# Patient Record
Sex: Female | Born: 1975
Health system: Southern US, Community
[De-identification: ages and names within clinical notes are randomized; demographics above are authoritative.]

## PROBLEM LIST (undated history)

## (undated) DIAGNOSIS — N12 Tubulo-interstitial nephritis, not specified as acute or chronic: Secondary | ICD-10-CM

## (undated) DIAGNOSIS — K219 Gastro-esophageal reflux disease without esophagitis: Secondary | ICD-10-CM

## (undated) DIAGNOSIS — Z6741 Type O blood, Rh negative: Secondary | ICD-10-CM

## (undated) HISTORY — DX: Tubulo-interstitial nephritis, not specified as acute or chronic: N12

## (undated) HISTORY — DX: Gastro-esophageal reflux disease without esophagitis: K21.9

## (undated) HISTORY — DX: Type O blood, Rh negative: Z67.41

---

## 2000-08-08 DIAGNOSIS — N12 Tubulo-interstitial nephritis, not specified as acute or chronic: Secondary | ICD-10-CM

## 2000-08-08 HISTORY — DX: Tubulo-interstitial nephritis, not specified as acute or chronic: N12

## 2006-07-24 ENCOUNTER — Ambulatory Visit: Payer: Self-pay | Admitting: Gynecology

## 2006-07-24 ENCOUNTER — Encounter (INDEPENDENT_AMBULATORY_CARE_PROVIDER_SITE_OTHER): Payer: Self-pay | Admitting: Gynecology

## 2007-07-19 ENCOUNTER — Encounter (INDEPENDENT_AMBULATORY_CARE_PROVIDER_SITE_OTHER): Payer: Self-pay | Admitting: Gynecology

## 2007-07-19 ENCOUNTER — Ambulatory Visit: Payer: Self-pay | Admitting: Gynecology

## 2009-05-12 ENCOUNTER — Encounter: Payer: Self-pay | Admitting: Obstetrics & Gynecology

## 2009-05-12 ENCOUNTER — Ambulatory Visit: Payer: Self-pay | Admitting: Obstetrics & Gynecology

## 2009-08-08 HISTORY — PX: AUGMENTATION MAMMAPLASTY: SUR837

## 2009-08-08 HISTORY — PX: BREAST ENHANCEMENT SURGERY: SHX7

## 2010-07-06 ENCOUNTER — Ambulatory Visit: Payer: Self-pay | Admitting: Obstetrics & Gynecology

## 2010-12-21 NOTE — Assessment & Plan Note (Signed)
Holly Powell                 ACCOUNT NO.:  [id number]   MEDICAL RECORD NO.:  [id number]          PATIENT TYPE:  POB   LOCATION:  [facility] at [facility]         FACILITY:  [facility]   PHYSICIAN:  Jaynie Collins, MD     DATE OF BIRTH:  January 28, 1976   DATE OF SERVICE:  07/06/2010                                  CLINIC NOTE   REASON FOR VISIT:  Annual examination.   Holly Powell is a 35-year gravida 4, para 4 with last menstrual period of  June 18, 2010, who is here for annual examination.  The patient does  report having breakthrough bleeding about 3-4 episodes over the last  year and feels this is related to her Aldean Ast which is a 20 mcg estradiol  pill.  She has it around the same time every cycle.  She denies any  abdominal pain or any other gynecologic symptoms.  The patient underwent  a divorce since the last time we saw her and does report she is in a  sexual monogamous relationship with her boyfriend for the past year.  She denies any other gynecologic concerns.   PAST OB/GYN HISTORY:  Four vaginal deliveries.  The patient has normal  menstrual cycles that are regular but periodically has breakthrough  bleeding on the Lutera.  She does not have any history of cervical  dysplasia or sexually transmitted infections.  Her last Pap smear was in  October 2010 which was normal.   PAST MEDICAL HISTORY:  None.   PAST SURGICAL HISTORY:  The patient had bilateral saline breast implants  in August of this year.   MEDICATIONS:  Lutera.   ALLERGIES:  PENICILLIN which causes a rash.   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  She  lives with her 4 children.  She denies smoking, drinking, or using  illicit drugs.  She denies any past or current history of sexual or  physical abuse.   FAMILY HISTORY:  The patient's mother has diabetes, pancreatitis, and  high blood pressure.  There is no other  family history of any condition  and she denies any family history of any gynecologic, colon, or breast  cancers.   REVIEW OF SYSTEMS:  Comprehensive review of systems was reviewed with  the patient, was entirely negative.   PHYSICAL EXAMINATION:  VITAL SIGNS:  Blood pressure is 115/74, pulse 82,  weight 147 pounds, height 5 feet and 3-1/2 inches.  GENERAL:  No apparent distress.  HEENT:  Normocephalic, atraumatic.  NECK:  Supple.  No masses.  Normal thyroid.  LUNGS:  Clear to auscultation bilaterally.  HEART:  Regular rate and rhythm.  ABDOMEN:  Soft, nontender, nondistended.  BREASTS:  Symmetric in size, soft.  No tenderness, abnormal masses, skin  changes, lymphadenopathy, or nipple drainage noted.  EXTREMITIES:  No cyanosis, clubbing, or edema.  Nontender.  PELVIC:  Normal external female genitalia.  Pink, well-rugated vagina.  Small mobile uterus.  Normal adnexa.  Normal cervix.  Pap smear was  obtained.   ASSESSMENT AND PLAN:  The patient is a 35 year old gravida 4, para 4 who  is here for annual examination.  She did have a normal breast  examination and Pap smear was obtained.  We will follow up on the  results.  The patient did want a refill for her birth control method but  wanted to try another medication given her episodes of breakthrough  bleeding.  She was given samples of Femcon Fe and given that this does  have more estradiol and also a prescription for this.  Due to cough, she  was also given an alternate prescription for Ortho-Cyclen and told that  she can use either one and see whichever one is cheaper for her and see  if that also helps with her breakthrough bleeding.  If this does  continue to occur even with the higher estradiol pill, we will consider  an ultrasound to rule out a structural anomaly versus other evaluation  which may include laboratory evaluation or an endometrial biopsy.  The  patient was counseled regarding other modalities of birth control   including the NuvaRing and intrauterine device Implanon but she does not  want to try this and also does not want permanent sterilization.  The  patient was told to call or come back in for any further gynecologic  concerns.           ______________________________  Jaynie Collins, MD     UA/MEDQ  D:  07/06/2010  T:  07/06/2010  Job:  [id number]

## 2010-12-21 NOTE — Assessment & Plan Note (Signed)
Holly Powell, Holly Powell                 ACCOUNT NO.:  [id number]   MEDICAL RECORD NO.:  [id number]          PATIENT TYPE:  POB   LOCATION:  [facility] at [facility]         FACILITY:  [facility]   PHYSICIAN:  Jaynie Collins, MD     DATE OF BIRTH:  [birth year]   DATE OF SERVICE:  05/12/2009                                  CLINIC NOTE   The patient is a 35 year old, gravida 4, para 4, who is here for her  annual examination.  The patient was last seen in December 2008.  She  reports that she is on [place] birth control which she has gone off and  on because at some point, she thought she was going to try to get  pregnant and she also has alternating periods of taking it continuously  and also taking it with having the placebo week and in the last 6  months, she has realized that even though she is not taking it  continuously her menstrual periods come every 6 weeks.  She is now  starting to take them continuously and says that this is going much  better and she wants a refill for the Lutera to be able to take it  continuously.  She wants a refill to be faxed to her [employer].  Apart from her concerns about her periods, she has no other concerns.  She has no intermenstrual bleeding.  No abnormal vaginal discharge.  No  problems with intercourse or any other gynecologic symptoms.   The patient is concerned about the number of people that she notes that  her young getting breast cancer.  She is wondering if there was any way  she can get an mammogram scheduled.  She is also looking for more  information about the new hysteroscopic permanent sterilization using  silicone implants and she was told that as more information becomes  available this will may be made available to her.   PAST OBSTETRICAL/GYNECOLOGICAL HISTORY:  The patient has had 4 vaginal  deliveries.  Her menstrual history is as above.  She is satisfied with  the Lutera 28.  She is in a monogamous relationship and is married.  Her  last  Pap smear was in December 2008 which was normal.  She does not have  any history of abnormal Pap smears or sexually transmitted infections.   PAST MEDICAL HISTORY:  None.   PAST SURGICAL HISTORY:  None.   MEDICATIONS:  Lutera 28.   ALLERGIES:  PENICILLIN which causes a rash.   SOCIAL HISTORY:  The patient works as an English as a second language teacher at  [employer], who they specialized in Cytogeneticist to  schools.  She lives with her husband and 4 kids.  She denies smoking,  drinking, or taking illicit drugs.  She denies any past or current  history of sexual or physical abuse.   FAMILY HISTORY:  The patient's mother has diabetes and pancreatitis and  also high blood pressure.  There is no other family history of any  condition and she denies any family history of any cancers including  gynecologic, colon, or breast.   REVIEW OF SYSTEMS:  Comprehensive review of systems was  reviewed with  the patient that was entirely negative.  The patient does say that she  exercises 5 times a week and she does not take multivitamins, this was  strongly encouraged.   PHYSICAL EXAMINATION:  VITAL SIGNS:  Blood pressure 120/80, pulse 71,  weight 139 pounds, height 5-3-1/2 inches.  GENERAL:  No apparent distress.  HEENT:  Normocephalic, atraumatic.  NECK:  Supple.  No masses.  Normal thyroid.  LUNGS:  Clear to auscultation bilaterally.  HEART:  Regular rate and rhythm.  BREASTS:  Symmetric in size, soft, no tenderness, abnormal masses, skin  changes, or lymphadenopathy, or nipple drainage noted.  ABDOMEN:  Soft,  nontender, nondistended.  EXTREMITIES:  No cyanosis, clubbing, or edema.  Nontender.  PELVIC:  Normal external female genitalia.  Pink and well rugated  vagina.  Small mobile anteverted uterus.  Normal adnexa on  bimanual exam.  SPECULUM:  The patient had a pink, well rugated vagina, normal  discharge.  No lesions.  Multiparous cervix noted.  Pap smear sample  obtained.  No  tenderness on examination.  Rectum within normal limits on  external evaluation.   ASSESSMENT AND PLAN:  The patient is a 35-year gravida 4, para 4, who is  here today for her annual examination.  The patient does want to take  her birth controls continuously and a prescription was made out to be  faxed over to [facility] for her to be able to do this.  She did have a  negative urine pregnancy test today.  The patient is interested in  hysteroscopic permanent sterilization and will think about it some more  before making a final decision.  She was told to call us if she decides  to proceed with this patient.  The patient has no other concerns.  She  was told to come back to the clinic for any further gynecologic  problems.  The patient did decline getting a sexually transmitted  infection screen today, but does want to check her lipid panel,  comprehensive metabolic panel, CBC, and a TSH.  She will return when she  is fasting to have these labs checked, and we will follow up the  results.           ______________________________  Jaynie Collins, MD     UA/MEDQ  D:  05/12/2009  T:  05/13/2009  Job:  [id number]

## 2012-03-16 ENCOUNTER — Other Ambulatory Visit (INDEPENDENT_AMBULATORY_CARE_PROVIDER_SITE_OTHER): Payer: 59 | Admitting: Gynecology

## 2012-03-16 DIAGNOSIS — N912 Amenorrhea, unspecified: Secondary | ICD-10-CM

## 2012-03-16 NOTE — Progress Notes (Signed)
Patient was seen today for a positive pregnacy test. Patient will be making a follow visit for new ob.

## 2012-03-28 ENCOUNTER — Other Ambulatory Visit (INDEPENDENT_AMBULATORY_CARE_PROVIDER_SITE_OTHER): Payer: 59 | Admitting: *Deleted

## 2012-03-28 DIAGNOSIS — Z3480 Encounter for supervision of other normal pregnancy, unspecified trimester: Secondary | ICD-10-CM

## 2012-03-28 NOTE — Progress Notes (Signed)
hcg quant/tn

## 2012-03-29 LAB — HCG, QUANTITATIVE, PREGNANCY: hCG, Beta Chain, Quant, S: 139708.5 m[IU]/mL

## 2012-04-04 ENCOUNTER — Ambulatory Visit (INDEPENDENT_AMBULATORY_CARE_PROVIDER_SITE_OTHER): Payer: 59 | Admitting: Obstetrics & Gynecology

## 2012-04-04 ENCOUNTER — Encounter: Payer: Self-pay | Admitting: Obstetrics & Gynecology

## 2012-04-04 VITALS — BP 111/68 | Ht 63.5 in | Wt 151.0 lb

## 2012-04-04 DIAGNOSIS — Z3490 Encounter for supervision of normal pregnancy, unspecified, unspecified trimester: Secondary | ICD-10-CM | POA: Insufficient documentation

## 2012-04-04 DIAGNOSIS — K219 Gastro-esophageal reflux disease without esophagitis: Secondary | ICD-10-CM

## 2012-04-04 DIAGNOSIS — Z124 Encounter for screening for malignant neoplasm of cervix: Secondary | ICD-10-CM

## 2012-04-04 DIAGNOSIS — O09529 Supervision of elderly multigravida, unspecified trimester: Secondary | ICD-10-CM

## 2012-04-04 DIAGNOSIS — Z3480 Encounter for supervision of other normal pregnancy, unspecified trimester: Secondary | ICD-10-CM

## 2012-04-04 MED ORDER — PANTOPRAZOLE SODIUM 40 MG PO TBEC: 40.0000 mg | DELAYED_RELEASE_TABLET | Freq: Every day | ORAL | Status: DC

## 2012-04-04 NOTE — Progress Notes (Signed)
   Subjective:    Holly Powell is a [id number] [redacted]w[redacted]d being seen today for her first obstetrical visit.  Her obstetrical history is significant for advanced maternal age. Patient does intend to breast feed. Pregnancy history fully reviewed.  Patient reports heartburn, nausea, no bleeding and no dysuria.  Filed Vitals:   04/04/12 1118 04/04/12 1120  BP: 111/68   Height:  5' 3.5" (1.613 m)  Weight: 151 lb (68.493 kg)     HISTORY: OB History    Grav Para Term Preterm Abortions TAB SAB Ect Mult Living   5 4 4       4      # Outc Date GA Lbr Len/2nd Wgt Sex Del Anes PTL Lv   1 TRM 1994    M SVD EPI  Yes   2 TRM 41    M SVD EPI  Yes   3 TRM 2002    M SVD EPI  Yes   4 TRM 2005    M  None  Yes   Comments: Patient delivered in 3 hours, she barely made it to the hospital.   5 CUR              Past Medical History  Diagnosis Date  . Pyelonephritis 2002    history of in2002 pregnancy   Past Surgical History  Procedure Date  . Breast enhancement surgery 2011   Family History  Problem Relation Age of Onset  . Diabetes Mother   . Hypertension Mother   . Pancreatitis Mother      Exam    Uterus:   8 weeks  Pelvic Exam:    Perineum: No Hemorrhoids   Vulva: normal   Vagina:  normal mucosa, normal discharge   pH:    Cervix: no lesions   Adnexa: normal adnexa   Bony Pelvis: average  System: Breast:  normal appearance, no masses or tenderness, bilateral implants   Skin: normal coloration and turgor, no rashes    Neurologic: oriented, normal, normal mood   Extremities: normal strength, tone, and muscle mass   HEENT oropharynx clear, no lesions, neck supple with midline trachea and thyroid without masses   Mouth/Teeth mucous membranes moist, pharynx normal without lesions and dental hygiene good   Neck supple and no masses   Cardiovascular: regular rate and rhythm   Respiratory:  appears well, vitals normal, no respiratory distress, acyanotic, normal RR, neck free of mass or  lymphadenopathy, chest clear, no wheezing, crepitations, rhonchi, normal symmetric air entry   Abdomen: soft, non-tender; bowel sounds normal; no masses,  no organomegaly   Urinary: urethral meatus normal      Assessment:    Pregnancy: [id number] Patient Active Problem List  Diagnosis  . Supervision of normal pregnancy  . Maternal age 38+, multigravida     Nausea and heartburn  Plan:     Initial labs drawn. Prenatal vitamins. Problem list reviewed and updated. Genetic Screening discussed First Screen, Quad Screen and Amniocentesis: undecided.  Ultrasound discussed; fetal survey: at 18-19 weeks.  Follow up in 4 weeks. 50% of 30 min visit spent on counseling and coordination of care.  RTC 4 weeks Protonix for GERD sx   Yusif Gnau 04/04/2012

## 2012-04-04 NOTE — Progress Notes (Signed)
New OB visit.  Needs ob panel drawn.  Last pap done at least two years ago.

## 2012-04-04 NOTE — Patient Instructions (Signed)
AFP Maternal This is a routine screen (tests) used to check for fetal abnormalities such as Down syndrome and neural tube defects. Down Syndrome is a chromosomal abnormality, sometimes called Trisomy 21. Neural tube defects are serious birth defects. The brain, spinal cord, or their coverings do not develop completely. Women should be tested in the 15th to 20th week of pregnancy. The msAFP screen involves three or four tests that measure substances found in the blood that make the testing better. During development, AFP levels in fetal blood and amniotic fluid rise until about 12 weeks. The levels then gradually fall until birth. AFP is a protein produce by fetal tissue. AFP crosses the placenta and appears in the maternal blood. A baby with an open neural tube defect has an opening in its spine, head, or abdominal wall that allows higher-than-usual amounts of AFP to pass into the mother's blood. If a screen is positive, more tests are needed to make a diagnosis. These include ultrasound and perhaps amniocentesis (checking the fluid that surrounds the baby). These tests are used to help women and their caregivers make decisions about the management of their pregnancies. In pregnancies where the fetus is carrying the chromosomal defect that results in Down syndrome, the levels of AFP and unconjugated estriol tend to be low and hCG and inhibin A levels high.  PREPARATION FOR TEST Blood is drawn from a vein in your arm usually between the 15th and 20th weeks of pregnancy. Four different tests on your blood are done. These are AFP, hCG, unconjugated estriol, and inhibin A. The combination of tests produces a more accurate result. NORMAL FINDINGS   Adult: less than 40ng/mL or less than 40 mg/L (SI units)   Child younger than1 year: less than 30 ng/mL  Ranges are stratified by weeks of gestation and vary among laboratories. Ranges for normal findings may vary among different laboratories and hospitals. You  should always check with your doctor after having lab work or other tests done to discuss the meaning of your test results and whether your values are considered within normal limits. MEANING OF TEST  These are screening tests. Not all fetal abnormalities will give positive test results. Of all women who have positive AFP screening results, only a very small number of them have babies who actually have a neural tube defect or chromosomal abnormality. Your caregiver will go over the test results with you and discuss the importance and meaning of your results, as well as treatment options and the need for additional tests if necessary. OBTAINING THE TEST RESULTS It is your responsibility to obtain your test results. Ask the lab or department performing the test when and how you will get your results. Document Released: 08/16/2004 Document Revised: 07/14/2011 Document Reviewed: 06/28/2008 [employer] Patient Information 2012 [employer]. 

## 2012-04-05 LAB — OBSTETRIC PANEL
Antibody Screen: NEGATIVE
Basophils Relative: 0 % (ref 0–1)
Eosinophils Absolute: 0.1 10*3/uL (ref 0.0–0.7)
Eosinophils Relative: 1 % (ref 0–5)
MCH: 29.7 pg (ref 26.0–34.0)
MCHC: 33.6 g/dL (ref 30.0–36.0)
MCV: 88.3 fL (ref 78.0–100.0)
Monocytes Absolute: 0.5 10*3/uL (ref 0.1–1.0)
Monocytes Relative: 6 % (ref 3–12)
RBC: 3.6 MIL/uL — ABNORMAL LOW (ref 3.87–5.11)
Rh Type: NEGATIVE
WBC: 8.5 10*3/uL (ref 4.0–10.5)

## 2012-04-06 LAB — CYSTIC FIBROSIS DIAGNOSTIC STUDY: Date of Birth: [birth year]

## 2012-05-04 ENCOUNTER — Other Ambulatory Visit: Payer: Self-pay | Admitting: Obstetrics & Gynecology

## 2012-05-04 ENCOUNTER — Ambulatory Visit (INDEPENDENT_AMBULATORY_CARE_PROVIDER_SITE_OTHER): Payer: 59 | Admitting: Obstetrics & Gynecology

## 2012-05-04 VITALS — BP 109/69 | Wt 150.0 lb

## 2012-05-04 DIAGNOSIS — Z3490 Encounter for supervision of normal pregnancy, unspecified, unspecified trimester: Secondary | ICD-10-CM

## 2012-05-04 DIAGNOSIS — Z3682 Encounter for antenatal screening for nuchal translucency: Secondary | ICD-10-CM

## 2012-05-04 DIAGNOSIS — Z3480 Encounter for supervision of other normal pregnancy, unspecified trimester: Secondary | ICD-10-CM

## 2012-05-04 DIAGNOSIS — O09529 Supervision of elderly multigravida, unspecified trimester: Secondary | ICD-10-CM

## 2012-05-04 NOTE — Progress Notes (Signed)
First trimester screen/Harmony test ordered via MFM. No complaints. Obstetric precautions reviewed.

## 2012-05-04 NOTE — Progress Notes (Signed)
No other complaints or concerns.  Obstetric precautions reviewed.

## 2012-05-04 NOTE — Patient Instructions (Addendum)
Pregnancy - Second Trimester The second trimester of pregnancy (3 to 6 months) is a period of rapid growth for you and your baby. At the end of the sixth month, your baby is about 9 inches long and weighs 1 1/2 pounds. You will begin to feel the baby move between 18 and 20 weeks of the pregnancy. This is called quickening. Weight gain is faster. A clear fluid (colostrum) may leak out of your breasts. You may feel small contractions of the womb (uterus). This is known as false labor or Braxton-Hicks contractions. This is like a practice for labor when the baby is ready to be born. Usually, the problems with morning sickness have usually passed by the end of your first trimester. Some women develop small dark blotches (called cholasma, mask of pregnancy) on their face that usually goes away after the baby is born. Exposure to the sun makes the blotches worse. Acne may also develop in some pregnant women and pregnant women who have acne, may find that it goes away. PRENATAL EXAMS  Blood work may continue to be done during prenatal exams. These tests are done to check on your health and the probable health of your baby. Blood work is used to follow your blood levels (hemoglobin). Anemia (low hemoglobin) is common during pregnancy. Iron and vitamins are given to help prevent this. You will also be checked for diabetes between 24 and 28 weeks of the pregnancy. Some of the previous blood tests may be repeated.   The size of the uterus is measured during each visit. This is to make sure that the baby is continuing to grow properly according to the dates of the pregnancy.   Your blood pressure is checked every prenatal visit. This is to make sure you are not getting toxemia.   Your urine is checked to make sure you do not have an infection, diabetes or protein in the urine.   Your weight is checked often to make sure gains are happening at the suggested rate. This is to ensure that both you and your baby are  growing normally.   Sometimes, an ultrasound is performed to confirm the proper growth and development of the baby. This is a test which bounces harmless sound waves off the baby so your caregiver can more accurately determine due dates.  Sometimes, a specialized test is done on the amniotic fluid surrounding the baby. This test is called an amniocentesis. The amniotic fluid is obtained by sticking a needle into the belly (abdomen). This is done to check the chromosomes in instances where there is a concern about possible genetic problems with the baby. It is also sometimes done near the end of pregnancy if an early delivery is required. In this case, it is done to help make sure the baby's lungs are mature enough for the baby to live outside of the womb. CHANGES OCCURING IN THE SECOND TRIMESTER OF PREGNANCY Your body goes through many changes during pregnancy. They vary from person to person. Talk to your caregiver about changes you notice that you are concerned about.  During the second trimester, you will likely have an increase in your appetite. It is normal to have cravings for certain foods. This varies from person to person and pregnancy to pregnancy.   Your lower abdomen will begin to bulge.   You may have to urinate more often because the uterus and baby are pressing on your bladder. It is also common to get more bladder infections during pregnancy (  pain with urination). You can help this by drinking lots of fluids and emptying your bladder before and after intercourse.   You may begin to get stretch marks on your hips, abdomen, and breasts. These are normal changes in the body during pregnancy. There are no exercises or medications to take that prevent this change.   You may begin to develop swollen and bulging veins (varicose veins) in your legs. Wearing support hose, elevating your feet for 15 minutes, 3 to 4 times a day and limiting salt in your diet helps lessen the problem.    Heartburn may develop as the uterus grows and pushes up against the stomach. Antacids recommended by your caregiver helps with this problem. Also, eating smaller meals 4 to 5 times a day helps.   Constipation can be treated with a stool softener or adding bulk to your diet. Drinking lots of fluids, vegetables, fruits, and whole grains are helpful.   Exercising is also helpful. If you have been very active up until your pregnancy, most of these activities can be continued during your pregnancy. If you have been less active, it is helpful to start an exercise program such as walking.   Hemorrhoids (varicose veins in the rectum) may develop at the end of the second trimester. Warm sitz baths and hemorrhoid cream recommended by your caregiver helps hemorrhoid problems.   Backaches may develop during this time of your pregnancy. Avoid heavy lifting, wear low heal shoes and practice good posture to help with backache problems.   Some pregnant women develop tingling and numbness of their hand and fingers because of swelling and tightening of ligaments in the wrist (carpel tunnel syndrome). This goes away after the baby is born.   As your breasts enlarge, you may have to get a bigger bra. Get a comfortable, cotton, support bra. Do not get a nursing bra until the last month of the pregnancy if you will be nursing the baby.   You may get a dark line from your belly button to the pubic area called the linea nigra.   You may develop rosy cheeks because of increase blood flow to the face.   You may develop spider looking lines of the face, neck, arms and chest. These go away after the baby is born.  HOME CARE INSTRUCTIONS   It is extremely important to avoid all smoking, herbs, alcohol, and unprescribed drugs during your pregnancy. These chemicals affect the formation and growth of the baby. Avoid these chemicals throughout the pregnancy to ensure the delivery of a healthy infant.   Most of your home  care instructions are the same as suggested for the first trimester of your pregnancy. Keep your caregiver's appointments. Follow your caregiver's instructions regarding medication use, exercise and diet.   During pregnancy, you are providing food for you and your baby. Continue to eat regular, well-balanced meals. Choose foods such as meat, fish, milk and other low fat dairy products, vegetables, fruits, and whole-grain breads and cereals. Your caregiver will tell you of the ideal weight gain.   A physical sexual relationship may be continued up until near the end of pregnancy if there are no other problems. Problems could include early (premature) leaking of amniotic fluid from the membranes, vaginal bleeding, abdominal pain, or other medical or pregnancy problems.   Exercise regularly if there are no restrictions. Check with your caregiver if you are unsure of the safety of some of your exercises. The greatest weight gain will occur in the   last 2 trimesters of pregnancy. Exercise will help you:   Control your weight.   Get you in shape for labor and delivery.   Lose weight after you have the baby.   Wear a good support or jogging bra for breast tenderness during pregnancy. This may help if worn during sleep. Pads or tissues may be used in the bra if you are leaking colostrum.   Do not use hot tubs, steam rooms or saunas throughout the pregnancy.   Wear your seat belt at all times when driving. This protects you and your baby if you are in an accident.   Avoid raw meat, uncooked cheese, cat litter boxes and soil used by cats. These carry germs that can cause birth defects in the baby.   The second trimester is also a good time to visit your dentist for your dental health if this has not been done yet. Getting your teeth cleaned is OK. Use a soft toothbrush. Brush gently during pregnancy.   It is easier to loose urine during pregnancy. Tightening up and strengthening the pelvic muscles will  help with this problem. Practice stopping your urination while you are going to the bathroom. These are the same muscles you need to strengthen. It is also the muscles you would use as if you were trying to stop from passing gas. You can practice tightening these muscles up 10 times a set and repeating this about 3 times per day. Once you know what muscles to tighten up, do not perform these exercises during urination. It is more likely to contribute to an infection by backing up the urine.   Ask for help if you have financial, counseling or nutritional needs during pregnancy. Your caregiver will be able to offer counseling for these needs as well as refer you for other special needs.   Your skin may become oily. If so, wash your face with mild soap, use non-greasy moisturizer and oil or cream based makeup.  MEDICATIONS AND DRUG USE IN PREGNANCY  Take prenatal vitamins as directed. The vitamin should contain 1 milligram of folic acid. Keep all vitamins out of reach of children. Only a couple vitamins or tablets containing iron may be fatal to a baby or young child when ingested.   Avoid use of all medications, including herbs, over-the-counter medications, not prescribed or suggested by your caregiver. Only take over-the-counter or prescription medicines for pain, discomfort, or fever as directed by your caregiver. Do not use aspirin.   Let your caregiver also know about herbs you may be using.   Alcohol is related to a number of birth defects. This includes fetal alcohol syndrome. All alcohol, in any form, should be avoided completely. Smoking will cause low birth rate and premature babies.   Street or illegal drugs are very harmful to the baby. They are absolutely forbidden. A baby born to an addicted mother will be addicted at birth. The baby will go through the same withdrawal an adult does.  SEEK MEDICAL CARE IF:  You have any concerns or worries during your pregnancy. It is better to call with  your questions if you feel they cannot wait, rather than worry about them. SEEK IMMEDIATE MEDICAL CARE IF:   An unexplained oral temperature above 102 F (38.9 C) develops, or as your caregiver suggests.   You have leaking of fluid from the vagina (birth canal). If leaking membranes are suspected, take your temperature and tell your caregiver of this when you call.   There   is vaginal spotting, bleeding, or passing clots. Tell your caregiver of the amount and how many pads are used. Light spotting in pregnancy is common, especially following intercourse.   You develop a bad smelling vaginal discharge with a change in the color from clear to white.   You continue to feel sick to your stomach (nauseated) and have no relief from remedies suggested. You vomit blood or coffee ground-like materials.   You lose more than 2 pounds of weight or gain more than 2 pounds of weight over 1 week, or as suggested by your caregiver.   You notice swelling of your face, hands, feet, or legs.   You get exposed to German measles and have never had them.   You are exposed to fifth disease or chickenpox.   You develop belly (abdominal) pain. Round ligament discomfort is a common non-cancerous (benign) cause of abdominal pain in pregnancy. Your caregiver still must evaluate you.   You develop a bad headache that does not go away.   You develop fever, diarrhea, pain with urination, or shortness of breath.   You develop visual problems, blurry, or double vision.   You fall or are in a car accident or any kind of trauma.   There is mental or physical violence at home.  Document Released: 07/19/2001 Document Revised: 07/14/2011 Document Reviewed: 01/21/2009 [employer] Patient Information 2012 [employer]. 

## 2012-05-08 ENCOUNTER — Ambulatory Visit (HOSPITAL_COMMUNITY)
Admission: RE | Admit: 2012-05-08 | Discharge: 2012-05-08 | Disposition: A | Discharge status: Home / Self Care | Payer: 59 | Source: Ambulatory Visit | Attending: Family Medicine | Admitting: Family Medicine

## 2012-05-08 ENCOUNTER — Ambulatory Visit (HOSPITAL_COMMUNITY)
Admission: RE | Admit: 2012-05-08 | Discharge: 2012-05-08 | Disposition: A | Discharge status: Home / Self Care | Payer: 59 | Source: Ambulatory Visit

## 2012-05-08 ENCOUNTER — Other Ambulatory Visit: Payer: Self-pay

## 2012-05-08 ENCOUNTER — Ambulatory Visit (HOSPITAL_COMMUNITY)
Admission: RE | Admit: 2012-05-08 | Discharge: 2012-05-08 | Disposition: A | Discharge status: Home / Self Care | Payer: 59 | Source: Ambulatory Visit | Attending: Obstetrics & Gynecology | Admitting: Obstetrics & Gynecology

## 2012-05-08 VITALS — BP 113/74 | HR 69 | Wt 155.5 lb

## 2012-05-08 DIAGNOSIS — O3510X0 Maternal care for (suspected) chromosomal abnormality in fetus, unspecified, not applicable or unspecified: Secondary | ICD-10-CM | POA: Insufficient documentation

## 2012-05-08 DIAGNOSIS — O351XX0 Maternal care for (suspected) chromosomal abnormality in fetus, not applicable or unspecified: Secondary | ICD-10-CM | POA: Insufficient documentation

## 2012-05-08 DIAGNOSIS — O09529 Supervision of elderly multigravida, unspecified trimester: Secondary | ICD-10-CM | POA: Insufficient documentation

## 2012-05-08 DIAGNOSIS — Z3682 Encounter for antenatal screening for nuchal translucency: Secondary | ICD-10-CM

## 2012-05-08 DIAGNOSIS — Z3689 Encounter for other specified antenatal screening: Secondary | ICD-10-CM | POA: Insufficient documentation

## 2012-05-08 NOTE — Progress Notes (Signed)
Holly Powell  was seen today for an ultrasound appointment.  See full report in [facility].  Alpha Gula, MD

## 2012-05-08 NOTE — Progress Notes (Signed)
Genetic Counseling  High-Risk Gestation Note  Appointment Date:  05/08/2012 Referred By: Tereso Newcomer, MD Date of Birth:  21-Jul-1976   Pregnancy History: [id number] Estimated Date of Delivery: 11/17/12 Estimated Gestational Age: [redacted]w[redacted]d Attending: Alpha Gula, MD    Holly Powell and her partner were seen for genetic counseling because of a maternal age of 36 y.o.Marland Kitchen     They were counseled regarding maternal age and the association with risk for chromosome conditions due to nondisjunction with aging of the ova.   We reviewed chromosomes, nondisjunction, and the associated 1 in 12 risk for fetal aneuploidy at [redacted]w[redacted]d gestation related to a maternal age of 36 y.o. at delivery.  They were counseled that the risk for aneuploidy decreases as gestational age increases, accounting for those pregnancies which spontaneously abort.  We specifically discussed Down syndrome (trisomy 53), trisomies 68 and 86, and sex chromosome aneuploidies (47,XXX and 47,XXY) including the common features and prognoses of each.   We reviewed other available screening options including First screen, noninvasive prenatal testing (NIPT), and detailed ultrasound. They understand that screening tests are used to modify a patient's a priori risk for aneuploidy, typically based on age.  This estimate provides a pregnancy specific risk assessment.  Specifically, we discussed that NIPT analyzes cell free fetal DNA found in the maternal circulation. This test is not diagnostic for chromosome conditions, but can provide information regarding the presence or absence of extra fetal DNA for chromosomes 13, 18, 21, X, and Y, and missing fetal DNA for chromosome X and Y (Turner syndrome). Thus, it would not identify or rule out all genetic conditions. The reported detection rate is greater than 99% for Trisomy 21, greater than 98% for Trisomy 18, and is approximately 80% (8 out of 10) for Trisomy 13. The false positive rate is reported to be  less than 0.1% for any of these conditions.  In addition, we discussed that ~50-80% of fetuses with Down syndrome and up to 90-95% of fetuses with trisomy 18/13, when well visualized, have detectable anomalies or soft markers by detailed ultrasound (~18+ weeks gestation).   They were also counseled regarding diagnostic testing via [facility] or amniocentesis.  We reviewed the approximate 1 in 300-500 risk for complications for amniocentesis, including spontaneous pregnancy loss. We discussed the risks, limitations, and benefits of each screening and testing option. After consideration of all the options, and a clear understanding of the newness and limitations of NIPT, they elected to proceed with cell free fetal DNA testing ([facility]) at the time of today's visit and ultrasound for nuchal translucency assessment. NIPT results will be available in approximately 8-10 days.  The ultrasound results are reported under separate cover. They also expressed interest in having a detailed ultrasound.  Detailed ultrasound was scheduled for 06/19/12.    She understands that ultrasound cannot rule out all birth defects or genetic syndromes. The patient was advised of this limitation and states she still does not want diagnostic testing at this time or in the future given the associated risk of complications.    We discussed cystic fibrosis (CF) including the carrier frequency and incidence in the Caucasian population, the availability of carrier testing and prenatal diagnosis if indicated.  In addition, we discussed that CF is routinely screened for as part of the [place] newborn screening panel.  She declined testing today.   Both family histories were reviewed and found to be contributory for Fragile X syndrome in a niece and nephew to the father of the  pregnancy (his brother's children). We reviewed Fragile X syndrome and X-linked inheritance. Fragile X syndrome is a form of inherited mental retardation where predominantly affected  males are born to unaffected females, though there is variability in the condition. More than 99% of individuals with Fragile X syndrome results from an expanded segment (an expansion of the CGG trinucleotide repeat) of the FMR1 gene on the X chromosome. (Less than 1% of individuals with Fragile X syndrome have a deletion of FMR1 or an intragenic FMR1 mutation.) Females who are premutation carriers are at increased risk to have children affected with Fragile X syndrome, given the chance for a premutation to expand to a full mutation when transmitted to offspring. Individuals with greater than 200 repeats have full mutations and are expected to be clinically affected, if female, and be carriers or possibly clinically affected, if female. Carriers can be asymptomatic or have mild symptoms, such as learning disabilities. In X-linked inheritance, a female passes on his X chromosome to all of his daughters and none of his sons. For a female, there is a 1 in 2 (50%) chance of passing on a particular X chromosome to each child. Given that the individuals with Fragile X syndrome are related through their father (the father of the pregnancy's brother), this family history would not be expected to increased recurrence risk for Fragile X syndrome in the current pregnancy.  Additionally, the father of the pregnancy has a nephew (a son of the same brother but with a different partner) who has speech issues and some facial features described to differ from relatives. Detailed information was not known regarding the cause for his features.  Without further information regarding the provided family history, an accurate genetic risk cannot be calculated. Further genetic counseling is warranted if more information is obtained.  Ms. Holly Powell denied exposure to environmental toxins or chemical agents. She denied the use of alcohol, tobacco or street drugs. She denied significant viral illnesses during the course of her pregnancy.  Her medical and surgical histories were noncontributory.   I counseled this couple regarding the above risks and available options.  The approximate face-to-face time with the genetic counselor was 40 minutes.  Quinn Plowman, MS,  Certified Genetic Counselor 05/08/2012

## 2012-05-09 ENCOUNTER — Encounter: Payer: Self-pay | Admitting: Obstetrics & Gynecology

## 2012-05-21 ENCOUNTER — Telehealth (HOSPITAL_COMMUNITY): Payer: Self-pay | Admitting: MS"

## 2012-05-21 NOTE — Telephone Encounter (Signed)
Called Holly Powell to discuss her [facility], cell free fetal DNA testing. Testing was offered because of advanced maternal age. We reviewed that these are within normal limits, showing a less than 1 in 10,000 risk for trisomies 21, 18 and 13. We reviewed that this testing identifies > 99% of pregnancies with trisomy 21, >98% of pregnancies with trisomy 59, and >80% with trisomy 58; the false positive rate is <0.1% for all conditions. Testing was also performed for X and Y chromosome analysis. This did not show evidence of aneuploidy for X or Y. It was also consistent with female gender. X and Y analysis has a detection rate of approximately 99%. She understands that this testing does not identify all genetic conditions. All questions were answered to her satisfaction, she was encouraged to call with additional questions or concerns.  Holly Plowman, MS Certified Genetic Counselor 05/21/2012 3:02 PM     Left message for patient to return call.   Holly Powell 05/21/2012 2:27 PM

## 2012-06-01 ENCOUNTER — Ambulatory Visit (INDEPENDENT_AMBULATORY_CARE_PROVIDER_SITE_OTHER): Payer: 59 | Admitting: Family Medicine

## 2012-06-01 ENCOUNTER — Encounter: Payer: Self-pay | Admitting: Family Medicine

## 2012-06-01 VITALS — BP 105/63 | Wt 155.0 lb

## 2012-06-01 DIAGNOSIS — Z3490 Encounter for supervision of normal pregnancy, unspecified, unspecified trimester: Secondary | ICD-10-CM

## 2012-06-01 DIAGNOSIS — Z3480 Encounter for supervision of other normal pregnancy, unspecified trimester: Secondary | ICD-10-CM

## 2012-06-01 DIAGNOSIS — O09529 Supervision of elderly multigravida, unspecified trimester: Secondary | ICD-10-CM

## 2012-06-01 MED ORDER — INFLUENZA VIRUS VACC SPLIT PF IM SUSP: 0.5000 mL | Freq: Once | INTRAMUSCULAR | Status: DC

## 2012-06-01 NOTE — Patient Instructions (Signed)
Pregnancy - Second Trimester The second trimester of pregnancy (3 to 6 months) is a period of rapid growth for you and your baby. At the end of the sixth month, your baby is about 9 inches long and weighs 1 1/2 pounds. You will begin to feel the baby move between 18 and 20 weeks of the pregnancy. This is called quickening. Weight gain is faster. A clear fluid (colostrum) may leak out of your breasts. You may feel small contractions of the womb (uterus). This is known as false labor or Braxton-Hicks contractions. This is like a practice for labor when the baby is ready to be born. Usually, the problems with morning sickness have usually passed by the end of your first trimester. Some women develop small dark blotches (called cholasma, mask of pregnancy) on their face that usually goes away after the baby is born. Exposure to the sun makes the blotches worse. Acne may also develop in some pregnant women and pregnant women who have acne, may find that it goes away. PRENATAL EXAMS  Blood work may continue to be done during prenatal exams. These tests are done to check on your health and the probable health of your baby. Blood work is used to follow your blood levels (hemoglobin). Anemia (low hemoglobin) is common during pregnancy. Iron and vitamins are given to help prevent this. You will also be checked for diabetes between 24 and 28 weeks of the pregnancy. Some of the previous blood tests may be repeated.  The size of the uterus is measured during each visit. This is to make sure that the baby is continuing to grow properly according to the dates of the pregnancy.  Your blood pressure is checked every prenatal visit. This is to make sure you are not getting toxemia.  Your urine is checked to make sure you do not have an infection, diabetes or protein in the urine.  Your weight is checked often to make sure gains are happening at the suggested rate. This is to ensure that both you and your baby are  growing normally.  Sometimes, an ultrasound is performed to confirm the proper growth and development of the baby. This is a test which bounces harmless sound waves off the baby so your caregiver can more accurately determine due dates. Sometimes, a specialized test is done on the amniotic fluid surrounding the baby. This test is called an amniocentesis. The amniotic fluid is obtained by sticking a needle into the belly (abdomen). This is done to check the chromosomes in instances where there is a concern about possible genetic problems with the baby. It is also sometimes done near the end of pregnancy if an early delivery is required. In this case, it is done to help make sure the baby's lungs are mature enough for the baby to live outside of the womb. CHANGES OCCURING IN THE SECOND TRIMESTER OF PREGNANCY Your body goes through many changes during pregnancy. They vary from person to person. Talk to your caregiver about changes you notice that you are concerned about.  During the second trimester, you will likely have an increase in your appetite. It is normal to have cravings for certain foods. This varies from person to person and pregnancy to pregnancy.  Your lower abdomen will begin to bulge.  You may have to urinate more often because the uterus and baby are pressing on your bladder. It is also common to get more bladder infections during pregnancy (pain with urination). You can help this by   drinking lots of fluids and emptying your bladder before and after intercourse.  You may begin to get stretch marks on your hips, abdomen, and breasts. These are normal changes in the body during pregnancy. There are no exercises or medications to take that prevent this change.  You may begin to develop swollen and bulging veins (varicose veins) in your legs. Wearing support hose, elevating your feet for 15 minutes, 3 to 4 times a day and limiting salt in your diet helps lessen the problem.  Heartburn may  develop as the uterus grows and pushes up against the stomach. Antacids recommended by your caregiver helps with this problem. Also, eating smaller meals 4 to 5 times a day helps.  Constipation can be treated with a stool softener or adding bulk to your diet. Drinking lots of fluids, vegetables, fruits, and whole grains are helpful.  Exercising is also helpful. If you have been very active up until your pregnancy, most of these activities can be continued during your pregnancy. If you have been less active, it is helpful to start an exercise program such as walking.  Hemorrhoids (varicose veins in the rectum) may develop at the end of the second trimester. Warm sitz baths and hemorrhoid cream recommended by your caregiver helps hemorrhoid problems.  Backaches may develop during this time of your pregnancy. Avoid heavy lifting, wear low heal shoes and practice good posture to help with backache problems.  Some pregnant women develop tingling and numbness of their hand and fingers because of swelling and tightening of ligaments in the wrist (carpel tunnel syndrome). This goes away after the baby is born.  As your breasts enlarge, you may have to get a bigger bra. Get a comfortable, cotton, support bra. Do not get a nursing bra until the last month of the pregnancy if you will be nursing the baby.  You may get a dark line from your belly button to the pubic area called the linea nigra.  You may develop rosy cheeks because of increase blood flow to the face.  You may develop spider looking lines of the face, neck, arms and chest. These go away after the baby is born. HOME CARE INSTRUCTIONS   It is extremely important to avoid all smoking, herbs, alcohol, and unprescribed drugs during your pregnancy. These chemicals affect the formation and growth of the baby. Avoid these chemicals throughout the pregnancy to ensure the delivery of a healthy infant.  Most of your home care instructions are the same  as suggested for the first trimester of your pregnancy. Keep your caregiver's appointments. Follow your caregiver's instructions regarding medication use, exercise and diet.  During pregnancy, you are providing food for you and your baby. Continue to eat regular, well-balanced meals. Choose foods such as meat, fish, milk and other low fat dairy products, vegetables, fruits, and whole-grain breads and cereals. Your caregiver will tell you of the ideal weight gain.  A physical sexual relationship may be continued up until near the end of pregnancy if there are no other problems. Problems could include early (premature) leaking of amniotic fluid from the membranes, vaginal bleeding, abdominal pain, or other medical or pregnancy problems.  Exercise regularly if there are no restrictions. Check with your caregiver if you are unsure of the safety of some of your exercises. The greatest weight gain will occur in the last 2 trimesters of pregnancy. Exercise will help you:  Control your weight.  Get you in shape for labor and delivery.  Lose weight   after you have the baby.  Wear a good support or jogging bra for breast tenderness during pregnancy. This may help if worn during sleep. Pads or tissues may be used in the bra if you are leaking colostrum.  Do not use hot tubs, steam rooms or saunas throughout the pregnancy.  Wear your seat belt at all times when driving. This protects you and your baby if you are in an accident.  Avoid raw meat, uncooked cheese, cat litter boxes and soil used by cats. These carry germs that can cause birth defects in the baby.  The second trimester is also a good time to visit your dentist for your dental health if this has not been done yet. Getting your teeth cleaned is OK. Use a soft toothbrush. Brush gently during pregnancy.  It is easier to loose urine during pregnancy. Tightening up and strengthening the pelvic muscles will help with this problem. Practice stopping  your urination while you are going to the bathroom. These are the same muscles you need to strengthen. It is also the muscles you would use as if you were trying to stop from passing gas. You can practice tightening these muscles up 10 times a set and repeating this about 3 times per day. Once you know what muscles to tighten up, do not perform these exercises during urination. It is more likely to contribute to an infection by backing up the urine.  Ask for help if you have financial, counseling or nutritional needs during pregnancy. Your caregiver will be able to offer counseling for these needs as well as refer you for other special needs.  Your skin may become oily. If so, wash your face with mild soap, use non-greasy moisturizer and oil or cream based makeup. MEDICATIONS AND DRUG USE IN PREGNANCY  Take prenatal vitamins as directed. The vitamin should contain 1 milligram of folic acid. Keep all vitamins out of reach of children. Only a couple vitamins or tablets containing iron may be fatal to a baby or young child when ingested.  Avoid use of all medications, including herbs, over-the-counter medications, not prescribed or suggested by your caregiver. Only take over-the-counter or prescription medicines for pain, discomfort, or fever as directed by your caregiver. Do not use aspirin.  Let your caregiver also know about herbs you may be using.  Alcohol is related to a number of birth defects. This includes fetal alcohol syndrome. All alcohol, in any form, should be avoided completely. Smoking will cause low birth rate and premature babies.  Street or illegal drugs are very harmful to the baby. They are absolutely forbidden. A baby born to an addicted mother will be addicted at birth. The baby will go through the same withdrawal an adult does. SEEK MEDICAL CARE IF:  You have any concerns or worries during your pregnancy. It is better to call with your questions if you feel they cannot wait,  rather than worry about them. SEEK IMMEDIATE MEDICAL CARE IF:   An unexplained oral temperature above 102 F (38.9 C) develops, or as your caregiver suggests.  You have leaking of fluid from the vagina (birth canal). If leaking membranes are suspected, take your temperature and tell your caregiver of this when you call.  There is vaginal spotting, bleeding, or passing clots. Tell your caregiver of the amount and how many pads are used. Light spotting in pregnancy is common, especially following intercourse.  You develop a bad smelling vaginal discharge with a change in the color from clear   to white.  You continue to feel sick to your stomach (nauseated) and have no relief from remedies suggested. You vomit blood or coffee ground-like materials.  You lose more than 2 pounds of weight or gain more than 2 pounds of weight over 1 week, or as suggested by your caregiver.  You notice swelling of your face, hands, feet, or legs.  You get exposed to German measles and have never had them.  You are exposed to fifth disease or chickenpox.  You develop belly (abdominal) pain. Round ligament discomfort is a common non-cancerous (benign) cause of abdominal pain in pregnancy. Your caregiver still must evaluate you.  You develop a bad headache that does not go away.  You develop fever, diarrhea, pain with urination, or shortness of breath.  You develop visual problems, blurry, or double vision.  You fall or are in a car accident or any kind of trauma.  There is mental or physical violence at home. Document Released: 07/19/2001 Document Revised: 10/17/2011 Document Reviewed: 01/21/2009 [employer] Patient Information 2013 [employer].  Breastfeeding Deciding to breastfeed is one of the best choices you can make for you and your baby. The information that follows gives a brief overview of the benefits of breastfeeding as well as common topics surrounding breastfeeding. BENEFITS OF  BREASTFEEDING For the baby  The first milk (colostrum) helps the baby's digestive system function better.   There are antibodies in the mother's milk that help the baby fight off infections.   The baby has a lower incidence of asthma, allergies, and sudden infant death syndrome (SIDS).   The nutrients in breast milk are better for the baby than infant formulas, and breast milk helps the baby's brain grow better.   Babies who breastfeed have less gas, colic, and constipation.  For the mother  Breastfeeding helps develop a very special bond between the mother and her baby.   Breastfeeding is convenient, always available at the correct temperature, and costs nothing.   Breastfeeding burns calories in the mother and helps her lose weight that was gained during pregnancy.   Breastfeeding makes the uterus contract back down to normal size faster and slows bleeding following delivery.   Breastfeeding mothers have a lower risk of developing breast cancer.  BREASTFEEDING FREQUENCY  A healthy, full-term baby may breastfeed as often as every hour or space his or her feedings to every 3 hours.   Watch your baby for signs of hunger. Nurse your baby if he or she shows signs of hunger. How often you nurse will vary from baby to baby.   Nurse as often as the baby requests, or when you feel the need to reduce the fullness of your breasts.   Awaken the baby if it has been 3 4 hours since the last feeding.   Frequent feeding will help the mother make more milk and will help prevent problems, such as sore nipples and engorgement of the breasts.  BABY'S POSITION AT THE BREAST  Whether lying down or sitting, be sure that the baby's tummy is facing your tummy.   Support the breast with 4 fingers underneath the breast and the thumb above. Make sure your fingers are well away from the nipple and baby's mouth.   Stroke the baby's lips gently with your finger or nipple.   When the  baby's mouth is open wide enough, place all of your nipple and as much of the areola as possible into your baby's mouth.   Pull the baby in   close so the tip of the nose and the baby's cheeks touch the breast during the feeding.  FEEDINGS AND SUCTION  The length of each feeding varies from baby to baby and from feeding to feeding.   The baby must suck about 2 3 minutes for your milk to get to him or her. This is called a "let down." For this reason, allow the baby to feed on each breast as long as he or she wants. Your baby will end the feeding when he or she has received the right balance of nutrients.   To break the suction, put your finger into the corner of the baby's mouth and slide it between his or her gums before removing your breast from his or her mouth. This will help prevent sore nipples.  HOW TO TELL WHETHER YOUR BABY IS GETTING ENOUGH BREAST MILK. Wondering whether or not your baby is getting enough milk is a common concern among mothers. You can be assured that your baby is getting enough milk if:   Your baby is actively sucking and you hear swallowing.   Your baby seems relaxed and satisfied after a feeding.   Your baby nurses at least 8 12 times in a 24 hour time period. Nurse your baby until he or she unlatches or falls asleep at the first breast (at least 10 20 minutes), then offer the second side.   Your baby is wetting 5 6 disposable diapers (6 8 cloth diapers) in a 24 hour period by 5 6 days of age.   Your baby is having at least 3 4 stools every 24 hours for the first 6 weeks. The stool should be soft and yellow.   Your baby should gain 4 7 ounces per week after he or she is 4 days old.   Your breasts feel softer after nursing.  REDUCING BREAST ENGORGEMENT  In the first week after your baby is born, you may experience signs of breast engorgement. When breasts are engorged, they feel heavy, warm, full, and may be tender to the touch. You can reduce  engorgement if you:   Nurse frequently, every 2 3 hours. Mothers who breastfeed early and often have fewer problems with engorgement.   Place light ice packs on your breasts for 10 20 minutes between feedings. This reduces swelling. Wrap the ice packs in a lightweight towel to protect your skin. Bags of frozen vegetables work well for this purpose.   Take a warm shower or apply warm, moist heat to your breast for 5 10 minutes just before each feeding. This increases circulation and helps the milk flow.   Gently massage your breast before and during the feeding. Using your finger tips, massage from the chest wall towards your nipple in a circular motion.   Make sure that the baby empties at least one breast at every feeding before switching sides.   Use a breast pump to empty the breasts if your baby is sleepy or not nursing well. You may also want to pump if you are returning to work oryou feel you are getting engorged.   Avoid bottle feeds, pacifiers, or supplemental feedings of water or juice in place of breastfeeding. Breast milk is all the food your baby needs. It is not necessary for your baby to have water or formula. In fact, to help your breasts make more milk, it is best not to give your baby supplemental feedings during the early weeks.   Be sure the baby is latched   on and positioned properly while breastfeeding.   Wear a supportive bra, avoiding underwire styles.   Eat a balanced diet with enough fluids.   Rest often, relax, and take your prenatal vitamins to prevent fatigue, stress, and anemia.  If you follow these suggestions, your engorgement should improve in 24 48 hours. If you are still experiencing difficulty, call your lactation consultant or caregiver.  CARING FOR YOURSELF Take care of your breasts  Bathe or shower daily.   Avoid using soap on your nipples.   Start feedings on your left breast at one feeding and on your right breast at the next  feeding.   You will notice an increase in your milk supply 2 5 days after delivery. You may feel some discomfort from engorgement, which makes your breasts very firm and often tender. Engorgement "peaks" out within 24 48 hours. In the meantime, apply warm moist towels to your breasts for 5 10 minutes before feeding. Gentle massage and expression of some milk before feeding will soften your breasts, making it easier for your baby to latch on.   Wear a well-fitting nursing bra, and air dry your nipples for a 3 4minutes after each feeding.   Only use cotton bra pads.   Only use pure lanolin on your nipples after nursing. You do not need to wash it off before feeding the baby again. Another option is to express a few drops of breast milk and gently massage it into your nipples.  Take care of yourself  Eat well-balanced meals and nutritious snacks.   Drinking milk, fruit juice, and water to satisfy your thirst (about 8 glasses a day).   Get plenty of rest.  Avoid foods that you notice affect the baby in a bad way.  SEEK MEDICAL CARE IF:   You have difficulty with breastfeeding and need help.   You have a hard, red, sore area on your breast that is accompanied by a fever.   Your baby is too sleepy to eat well or is having trouble sleeping.   Your baby is wetting less than 6 diapers a day, by 5 days of age.   Your baby's skin or white part of his or her eyes is more yellow than it was in the hospital.   You feel depressed.  Document Released: 07/25/2005 Document Revised: 01/24/2012 Document Reviewed: 10/23/2011 [employer] Patient Information 2013 [employer].  

## 2012-06-01 NOTE — Progress Notes (Signed)
Nml harmony Flu shot today Anatomy scheduled.

## 2012-06-19 ENCOUNTER — Ambulatory Visit (HOSPITAL_COMMUNITY)
Admission: RE | Admit: 2012-06-19 | Discharge: 2012-06-19 | Disposition: A | Discharge status: Home / Self Care | Payer: 59 | Source: Ambulatory Visit | Attending: Obstetrics & Gynecology | Admitting: Obstetrics & Gynecology

## 2012-06-19 DIAGNOSIS — O09529 Supervision of elderly multigravida, unspecified trimester: Secondary | ICD-10-CM

## 2012-06-19 DIAGNOSIS — O358XX0 Maternal care for other (suspected) fetal abnormality and damage, not applicable or unspecified: Secondary | ICD-10-CM | POA: Insufficient documentation

## 2012-06-19 DIAGNOSIS — Z1389 Encounter for screening for other disorder: Secondary | ICD-10-CM | POA: Insufficient documentation

## 2012-06-19 DIAGNOSIS — Z363 Encounter for antenatal screening for malformations: Secondary | ICD-10-CM | POA: Insufficient documentation

## 2012-06-28 ENCOUNTER — Ambulatory Visit (INDEPENDENT_AMBULATORY_CARE_PROVIDER_SITE_OTHER): Payer: 59 | Admitting: Family Medicine

## 2012-06-28 DIAGNOSIS — Z3480 Encounter for supervision of other normal pregnancy, unspecified trimester: Secondary | ICD-10-CM

## 2012-06-28 NOTE — Progress Notes (Signed)
Only concern is some incontinence when she sneezes or coughs.

## 2012-06-28 NOTE — Patient Instructions (Signed)
Pregnancy - Second Trimester The second trimester of pregnancy (3 to 6 months) is a period of rapid growth for you and your baby. At the end of the sixth month, your baby is about 9 inches long and weighs 1 1/2 pounds. You will begin to feel the baby move between 18 and 20 weeks of the pregnancy. This is called quickening. Weight gain is faster. A clear fluid (colostrum) may leak out of your breasts. You may feel small contractions of the womb (uterus). This is known as false labor or Braxton-Hicks contractions. This is like a practice for labor when the baby is ready to be born. Usually, the problems with morning sickness have usually passed by the end of your first trimester. Some women develop small dark blotches (called cholasma, mask of pregnancy) on their face that usually goes away after the baby is born. Exposure to the sun makes the blotches worse. Acne may also develop in some pregnant women and pregnant women who have acne, may find that it goes away. PRENATAL EXAMS  Blood work may continue to be done during prenatal exams. These tests are done to check on your health and the probable health of your baby. Blood work is used to follow your blood levels (hemoglobin). Anemia (low hemoglobin) is common during pregnancy. Iron and vitamins are given to help prevent this. You will also be checked for diabetes between 24 and 28 weeks of the pregnancy. Some of the previous blood tests may be repeated.  The size of the uterus is measured during each visit. This is to make sure that the baby is continuing to grow properly according to the dates of the pregnancy.  Your blood pressure is checked every prenatal visit. This is to make sure you are not getting toxemia.  Your urine is checked to make sure you do not have an infection, diabetes or protein in the urine.  Your weight is checked often to make sure gains are happening at the suggested rate. This is to ensure that both you and your baby are  growing normally.  Sometimes, an ultrasound is performed to confirm the proper growth and development of the baby. This is a test which bounces harmless sound waves off the baby so your caregiver can more accurately determine due dates. Sometimes, a specialized test is done on the amniotic fluid surrounding the baby. This test is called an amniocentesis. The amniotic fluid is obtained by sticking a needle into the belly (abdomen). This is done to check the chromosomes in instances where there is a concern about possible genetic problems with the baby. It is also sometimes done near the end of pregnancy if an early delivery is required. In this case, it is done to help make sure the baby's lungs are mature enough for the baby to live outside of the womb. CHANGES OCCURING IN THE SECOND TRIMESTER OF PREGNANCY Your body goes through many changes during pregnancy. They vary from person to person. Talk to your caregiver about changes you notice that you are concerned about.  During the second trimester, you will likely have an increase in your appetite. It is normal to have cravings for certain foods. This varies from person to person and pregnancy to pregnancy.  Your lower abdomen will begin to bulge.  You may have to urinate more often because the uterus and baby are pressing on your bladder. It is also common to get more bladder infections during pregnancy (pain with urination). You can help this by   drinking lots of fluids and emptying your bladder before and after intercourse.  You may begin to get stretch marks on your hips, abdomen, and breasts. These are normal changes in the body during pregnancy. There are no exercises or medications to take that prevent this change.  You may begin to develop swollen and bulging veins (varicose veins) in your legs. Wearing support hose, elevating your feet for 15 minutes, 3 to 4 times a day and limiting salt in your diet helps lessen the problem.  Heartburn may  develop as the uterus grows and pushes up against the stomach. Antacids recommended by your caregiver helps with this problem. Also, eating smaller meals 4 to 5 times a day helps.  Constipation can be treated with a stool softener or adding bulk to your diet. Drinking lots of fluids, vegetables, fruits, and whole grains are helpful.  Exercising is also helpful. If you have been very active up until your pregnancy, most of these activities can be continued during your pregnancy. If you have been less active, it is helpful to start an exercise program such as walking.  Hemorrhoids (varicose veins in the rectum) may develop at the end of the second trimester. Warm sitz baths and hemorrhoid cream recommended by your caregiver helps hemorrhoid problems.  Backaches may develop during this time of your pregnancy. Avoid heavy lifting, wear low heal shoes and practice good posture to help with backache problems.  Some pregnant women develop tingling and numbness of their hand and fingers because of swelling and tightening of ligaments in the wrist (carpel tunnel syndrome). This goes away after the baby is born.  As your breasts enlarge, you may have to get a bigger bra. Get a comfortable, cotton, support bra. Do not get a nursing bra until the last month of the pregnancy if you will be nursing the baby.  You may get a dark line from your belly button to the pubic area called the linea nigra.  You may develop rosy cheeks because of increase blood flow to the face.  You may develop spider looking lines of the face, neck, arms and chest. These go away after the baby is born. HOME CARE INSTRUCTIONS   It is extremely important to avoid all smoking, herbs, alcohol, and unprescribed drugs during your pregnancy. These chemicals affect the formation and growth of the baby. Avoid these chemicals throughout the pregnancy to ensure the delivery of a healthy infant.  Most of your home care instructions are the same  as suggested for the first trimester of your pregnancy. Keep your caregiver's appointments. Follow your caregiver's instructions regarding medication use, exercise and diet.  During pregnancy, you are providing food for you and your baby. Continue to eat regular, well-balanced meals. Choose foods such as meat, fish, milk and other low fat dairy products, vegetables, fruits, and whole-grain breads and cereals. Your caregiver will tell you of the ideal weight gain.  A physical sexual relationship may be continued up until near the end of pregnancy if there are no other problems. Problems could include early (premature) leaking of amniotic fluid from the membranes, vaginal bleeding, abdominal pain, or other medical or pregnancy problems.  Exercise regularly if there are no restrictions. Check with your caregiver if you are unsure of the safety of some of your exercises. The greatest weight gain will occur in the last 2 trimesters of pregnancy. Exercise will help you:  Control your weight.  Get you in shape for labor and delivery.  Lose weight   after you have the baby.  Wear a good support or jogging bra for breast tenderness during pregnancy. This may help if worn during sleep. Pads or tissues may be used in the bra if you are leaking colostrum.  Do not use hot tubs, steam rooms or saunas throughout the pregnancy.  Wear your seat belt at all times when driving. This protects you and your baby if you are in an accident.  Avoid raw meat, uncooked cheese, cat litter boxes and soil used by cats. These carry germs that can cause birth defects in the baby.  The second trimester is also a good time to visit your dentist for your dental health if this has not been done yet. Getting your teeth cleaned is OK. Use a soft toothbrush. Brush gently during pregnancy.  It is easier to loose urine during pregnancy. Tightening up and strengthening the pelvic muscles will help with this problem. Practice stopping  your urination while you are going to the bathroom. These are the same muscles you need to strengthen. It is also the muscles you would use as if you were trying to stop from passing gas. You can practice tightening these muscles up 10 times a set and repeating this about 3 times per day. Once you know what muscles to tighten up, do not perform these exercises during urination. It is more likely to contribute to an infection by backing up the urine.  Ask for help if you have financial, counseling or nutritional needs during pregnancy. Your caregiver will be able to offer counseling for these needs as well as refer you for other special needs.  Your skin may become oily. If so, wash your face with mild soap, use non-greasy moisturizer and oil or cream based makeup. MEDICATIONS AND DRUG USE IN PREGNANCY  Take prenatal vitamins as directed. The vitamin should contain 1 milligram of folic acid. Keep all vitamins out of reach of children. Only a couple vitamins or tablets containing iron may be fatal to a baby or young child when ingested.  Avoid use of all medications, including herbs, over-the-counter medications, not prescribed or suggested by your caregiver. Only take over-the-counter or prescription medicines for pain, discomfort, or fever as directed by your caregiver. Do not use aspirin.  Let your caregiver also know about herbs you may be using.  Alcohol is related to a number of birth defects. This includes fetal alcohol syndrome. All alcohol, in any form, should be avoided completely. Smoking will cause low birth rate and premature babies.  Street or illegal drugs are very harmful to the baby. They are absolutely forbidden. A baby born to an addicted mother will be addicted at birth. The baby will go through the same withdrawal an adult does. SEEK MEDICAL CARE IF:  You have any concerns or worries during your pregnancy. It is better to call with your questions if you feel they cannot wait,  rather than worry about them. SEEK IMMEDIATE MEDICAL CARE IF:   An unexplained oral temperature above 102 F (38.9 C) develops, or as your caregiver suggests.  You have leaking of fluid from the vagina (birth canal). If leaking membranes are suspected, take your temperature and tell your caregiver of this when you call.  There is vaginal spotting, bleeding, or passing clots. Tell your caregiver of the amount and how many pads are used. Light spotting in pregnancy is common, especially following intercourse.  You develop a bad smelling vaginal discharge with a change in the color from clear   to white.  You continue to feel sick to your stomach (nauseated) and have no relief from remedies suggested. You vomit blood or coffee ground-like materials.  You lose more than 2 pounds of weight or gain more than 2 pounds of weight over 1 week, or as suggested by your caregiver.  You notice swelling of your face, hands, feet, or legs.  You get exposed to German measles and have never had them.  You are exposed to fifth disease or chickenpox.  You develop belly (abdominal) pain. Round ligament discomfort is a common non-cancerous (benign) cause of abdominal pain in pregnancy. Your caregiver still must evaluate you.  You develop a bad headache that does not go away.  You develop fever, diarrhea, pain with urination, or shortness of breath.  You develop visual problems, blurry, or double vision.  You fall or are in a car accident or any kind of trauma.  There is mental or physical violence at home. Document Released: 07/19/2001 Document Revised: 10/17/2011 Document Reviewed: 01/21/2009 [employer] Patient Information 2013 [employer].  Breastfeeding Deciding to breastfeed is one of the best choices you can make for you and your baby. The information that follows gives a brief overview of the benefits of breastfeeding as well as common topics surrounding breastfeeding. BENEFITS OF  BREASTFEEDING For the baby  The first milk (colostrum) helps the baby's digestive system function better.   There are antibodies in the mother's milk that help the baby fight off infections.   The baby has a lower incidence of asthma, allergies, and sudden infant death syndrome (SIDS).   The nutrients in breast milk are better for the baby than infant formulas, and breast milk helps the baby's brain grow better.   Babies who breastfeed have less gas, colic, and constipation.  For the mother  Breastfeeding helps develop a very special bond between the mother and her baby.   Breastfeeding is convenient, always available at the correct temperature, and costs nothing.   Breastfeeding burns calories in the mother and helps her lose weight that was gained during pregnancy.   Breastfeeding makes the uterus contract back down to normal size faster and slows bleeding following delivery.   Breastfeeding mothers have a lower risk of developing breast cancer.  BREASTFEEDING FREQUENCY  A healthy, full-term baby may breastfeed as often as every hour or space his or her feedings to every 3 hours.   Watch your baby for signs of hunger. Nurse your baby if he or she shows signs of hunger. How often you nurse will vary from baby to baby.   Nurse as often as the baby requests, or when you feel the need to reduce the fullness of your breasts.   Awaken the baby if it has been 3 4 hours since the last feeding.   Frequent feeding will help the mother make more milk and will help prevent problems, such as sore nipples and engorgement of the breasts.  BABY'S POSITION AT THE BREAST  Whether lying down or sitting, be sure that the baby's tummy is facing your tummy.   Support the breast with 4 fingers underneath the breast and the thumb above. Make sure your fingers are well away from the nipple and baby's mouth.   Stroke the baby's lips gently with your finger or nipple.   When the  baby's mouth is open wide enough, place all of your nipple and as much of the areola as possible into your baby's mouth.   Pull the baby in   close so the tip of the nose and the baby's cheeks touch the breast during the feeding.  FEEDINGS AND SUCTION  The length of each feeding varies from baby to baby and from feeding to feeding.   The baby must suck about 2 3 minutes for your milk to get to him or her. This is called a "let down." For this reason, allow the baby to feed on each breast as long as he or she wants. Your baby will end the feeding when he or she has received the right balance of nutrients.   To break the suction, put your finger into the corner of the baby's mouth and slide it between his or her gums before removing your breast from his or her mouth. This will help prevent sore nipples.  HOW TO TELL WHETHER YOUR BABY IS GETTING ENOUGH BREAST MILK. Wondering whether or not your baby is getting enough milk is a common concern among mothers. You can be assured that your baby is getting enough milk if:   Your baby is actively sucking and you hear swallowing.   Your baby seems relaxed and satisfied after a feeding.   Your baby nurses at least 8 12 times in a 24 hour time period. Nurse your baby until he or she unlatches or falls asleep at the first breast (at least 10 20 minutes), then offer the second side.   Your baby is wetting 5 6 disposable diapers (6 8 cloth diapers) in a 24 hour period by 5 6 days of age.   Your baby is having at least 3 4 stools every 24 hours for the first 6 weeks. The stool should be soft and yellow.   Your baby should gain 4 7 ounces per week after he or she is 4 days old.   Your breasts feel softer after nursing.  REDUCING BREAST ENGORGEMENT  In the first week after your baby is born, you may experience signs of breast engorgement. When breasts are engorged, they feel heavy, warm, full, and may be tender to the touch. You can reduce  engorgement if you:   Nurse frequently, every 2 3 hours. Mothers who breastfeed early and often have fewer problems with engorgement.   Place light ice packs on your breasts for 10 20 minutes between feedings. This reduces swelling. Wrap the ice packs in a lightweight towel to protect your skin. Bags of frozen vegetables work well for this purpose.   Take a warm shower or apply warm, moist heat to your breast for 5 10 minutes just before each feeding. This increases circulation and helps the milk flow.   Gently massage your breast before and during the feeding. Using your finger tips, massage from the chest wall towards your nipple in a circular motion.   Make sure that the baby empties at least one breast at every feeding before switching sides.   Use a breast pump to empty the breasts if your baby is sleepy or not nursing well. You may also want to pump if you are returning to work oryou feel you are getting engorged.   Avoid bottle feeds, pacifiers, or supplemental feedings of water or juice in place of breastfeeding. Breast milk is all the food your baby needs. It is not necessary for your baby to have water or formula. In fact, to help your breasts make more milk, it is best not to give your baby supplemental feedings during the early weeks.   Be sure the baby is latched   on and positioned properly while breastfeeding.   Wear a supportive bra, avoiding underwire styles.   Eat a balanced diet with enough fluids.   Rest often, relax, and take your prenatal vitamins to prevent fatigue, stress, and anemia.  If you follow these suggestions, your engorgement should improve in 24 48 hours. If you are still experiencing difficulty, call your lactation consultant or caregiver.  CARING FOR YOURSELF Take care of your breasts  Bathe or shower daily.   Avoid using soap on your nipples.   Start feedings on your left breast at one feeding and on your right breast at the next  feeding.   You will notice an increase in your milk supply 2 5 days after delivery. You may feel some discomfort from engorgement, which makes your breasts very firm and often tender. Engorgement "peaks" out within 24 48 hours. In the meantime, apply warm moist towels to your breasts for 5 10 minutes before feeding. Gentle massage and expression of some milk before feeding will soften your breasts, making it easier for your baby to latch on.   Wear a well-fitting nursing bra, and air dry your nipples for a 3 4minutes after each feeding.   Only use cotton bra pads.   Only use pure lanolin on your nipples after nursing. You do not need to wash it off before feeding the baby again. Another option is to express a few drops of breast milk and gently massage it into your nipples.  Take care of yourself  Eat well-balanced meals and nutritious snacks.   Drinking milk, fruit juice, and water to satisfy your thirst (about 8 glasses a day).   Get plenty of rest.  Avoid foods that you notice affect the baby in a bad way.  SEEK MEDICAL CARE IF:   You have difficulty with breastfeeding and need help.   You have a hard, red, sore area on your breast that is accompanied by a fever.   Your baby is too sleepy to eat well or is having trouble sleeping.   Your baby is wetting less than 6 diapers a day, by 5 days of age.   Your baby's skin or white part of his or her eyes is more yellow than it was in the hospital.   You feel depressed.  Document Released: 07/25/2005 Document Revised: 01/24/2012 Document Reviewed: 10/23/2011 [employer] Patient Information 2013 [employer].  

## 2012-06-28 NOTE — Progress Notes (Signed)
Nml anatomy results reviewed Discussed incontinence.

## 2012-07-30 ENCOUNTER — Ambulatory Visit (INDEPENDENT_AMBULATORY_CARE_PROVIDER_SITE_OTHER): Payer: 59 | Admitting: Family Medicine

## 2012-07-30 VITALS — BP 98/65 | Wt 172.0 lb

## 2012-07-30 DIAGNOSIS — Z3490 Encounter for supervision of normal pregnancy, unspecified, unspecified trimester: Secondary | ICD-10-CM

## 2012-07-30 DIAGNOSIS — Z3480 Encounter for supervision of other normal pregnancy, unspecified trimester: Secondary | ICD-10-CM

## 2012-07-30 NOTE — Assessment & Plan Note (Signed)
Doing well 

## 2012-07-30 NOTE — Progress Notes (Signed)
Doing well--concerned about weight--reviewed.

## 2012-07-30 NOTE — Patient Instructions (Signed)
Pregnancy - Second Trimester The second trimester of pregnancy (3 to 6 months) is a period of rapid growth for you and your baby. At the end of the sixth month, your baby is about 9 inches long and weighs 1 1/2 pounds. You will begin to feel the baby move between 18 and 20 weeks of the pregnancy. This is called quickening. Weight gain is faster. A clear fluid (colostrum) may leak out of your breasts. You may feel small contractions of the womb (uterus). This is known as false labor or Braxton-Hicks contractions. This is like a practice for labor when the baby is ready to be born. Usually, the problems with morning sickness have usually passed by the end of your first trimester. Some women develop small dark blotches (called cholasma, mask of pregnancy) on their face that usually goes away after the baby is born. Exposure to the sun makes the blotches worse. Acne may also develop in some pregnant women and pregnant women who have acne, may find that it goes away. PRENATAL EXAMS  Blood work may continue to be done during prenatal exams. These tests are done to check on your health and the probable health of your baby. Blood work is used to follow your blood levels (hemoglobin). Anemia (low hemoglobin) is common during pregnancy. Iron and vitamins are given to help prevent this. You will also be checked for diabetes between 24 and 28 weeks of the pregnancy. Some of the previous blood tests may be repeated.  The size of the uterus is measured during each visit. This is to make sure that the baby is continuing to grow properly according to the dates of the pregnancy.  Your blood pressure is checked every prenatal visit. This is to make sure you are not getting toxemia.  Your urine is checked to make sure you do not have an infection, diabetes or protein in the urine.  Your weight is checked often to make sure gains are happening at the suggested rate. This is to ensure that both you and your baby are  growing normally.  Sometimes, an ultrasound is performed to confirm the proper growth and development of the baby. This is a test which bounces harmless sound waves off the baby so your caregiver can more accurately determine due dates. Sometimes, a specialized test is done on the amniotic fluid surrounding the baby. This test is called an amniocentesis. The amniotic fluid is obtained by sticking a needle into the belly (abdomen). This is done to check the chromosomes in instances where there is a concern about possible genetic problems with the baby. It is also sometimes done near the end of pregnancy if an early delivery is required. In this case, it is done to help make sure the baby's lungs are mature enough for the baby to live outside of the womb. CHANGES OCCURING IN THE SECOND TRIMESTER OF PREGNANCY Your body goes through many changes during pregnancy. They vary from person to person. Talk to your caregiver about changes you notice that you are concerned about.  During the second trimester, you will likely have an increase in your appetite. It is normal to have cravings for certain foods. This varies from person to person and pregnancy to pregnancy.  Your lower abdomen will begin to bulge.  You may have to urinate more often because the uterus and baby are pressing on your bladder. It is also common to get more bladder infections during pregnancy (pain with urination). You can help this by   drinking lots of fluids and emptying your bladder before and after intercourse.  You may begin to get stretch marks on your hips, abdomen, and breasts. These are normal changes in the body during pregnancy. There are no exercises or medications to take that prevent this change.  You may begin to develop swollen and bulging veins (varicose veins) in your legs. Wearing support hose, elevating your feet for 15 minutes, 3 to 4 times a day and limiting salt in your diet helps lessen the problem.  Heartburn may  develop as the uterus grows and pushes up against the stomach. Antacids recommended by your caregiver helps with this problem. Also, eating smaller meals 4 to 5 times a day helps.  Constipation can be treated with a stool softener or adding bulk to your diet. Drinking lots of fluids, vegetables, fruits, and whole grains are helpful.  Exercising is also helpful. If you have been very active up until your pregnancy, most of these activities can be continued during your pregnancy. If you have been less active, it is helpful to start an exercise program such as walking.  Hemorrhoids (varicose veins in the rectum) may develop at the end of the second trimester. Warm sitz baths and hemorrhoid cream recommended by your caregiver helps hemorrhoid problems.  Backaches may develop during this time of your pregnancy. Avoid heavy lifting, wear low heal shoes and practice good posture to help with backache problems.  Some pregnant women develop tingling and numbness of their hand and fingers because of swelling and tightening of ligaments in the wrist (carpel tunnel syndrome). This goes away after the baby is born.  As your breasts enlarge, you may have to get a bigger bra. Get a comfortable, cotton, support bra. Do not get a nursing bra until the last month of the pregnancy if you will be nursing the baby.  You may get a dark line from your belly button to the pubic area called the linea nigra.  You may develop rosy cheeks because of increase blood flow to the face.  You may develop spider looking lines of the face, neck, arms and chest. These go away after the baby is born. HOME CARE INSTRUCTIONS   It is extremely important to avoid all smoking, herbs, alcohol, and unprescribed drugs during your pregnancy. These chemicals affect the formation and growth of the baby. Avoid these chemicals throughout the pregnancy to ensure the delivery of a healthy infant.  Most of your home care instructions are the same  as suggested for the first trimester of your pregnancy. Keep your caregiver's appointments. Follow your caregiver's instructions regarding medication use, exercise and diet.  During pregnancy, you are providing food for you and your baby. Continue to eat regular, well-balanced meals. Choose foods such as meat, fish, milk and other low fat dairy products, vegetables, fruits, and whole-grain breads and cereals. Your caregiver will tell you of the ideal weight gain.  A physical sexual relationship may be continued up until near the end of pregnancy if there are no other problems. Problems could include early (premature) leaking of amniotic fluid from the membranes, vaginal bleeding, abdominal pain, or other medical or pregnancy problems.  Exercise regularly if there are no restrictions. Check with your caregiver if you are unsure of the safety of some of your exercises. The greatest weight gain will occur in the last 2 trimesters of pregnancy. Exercise will help you:  Control your weight.  Get you in shape for labor and delivery.  Lose weight   after you have the baby.  Wear a good support or jogging bra for breast tenderness during pregnancy. This may help if worn during sleep. Pads or tissues may be used in the bra if you are leaking colostrum.  Do not use hot tubs, steam rooms or saunas throughout the pregnancy.  Wear your seat belt at all times when driving. This protects you and your baby if you are in an accident.  Avoid raw meat, uncooked cheese, cat litter boxes and soil used by cats. These carry germs that can cause birth defects in the baby.  The second trimester is also a good time to visit your dentist for your dental health if this has not been done yet. Getting your teeth cleaned is OK. Use a soft toothbrush. Brush gently during pregnancy.  It is easier to loose urine during pregnancy. Tightening up and strengthening the pelvic muscles will help with this problem. Practice stopping  your urination while you are going to the bathroom. These are the same muscles you need to strengthen. It is also the muscles you would use as if you were trying to stop from passing gas. You can practice tightening these muscles up 10 times a set and repeating this about 3 times per day. Once you know what muscles to tighten up, do not perform these exercises during urination. It is more likely to contribute to an infection by backing up the urine.  Ask for help if you have financial, counseling or nutritional needs during pregnancy. Your caregiver will be able to offer counseling for these needs as well as refer you for other special needs.  Your skin may become oily. If so, wash your face with mild soap, use non-greasy moisturizer and oil or cream based makeup. MEDICATIONS AND DRUG USE IN PREGNANCY  Take prenatal vitamins as directed. The vitamin should contain 1 milligram of folic acid. Keep all vitamins out of reach of children. Only a couple vitamins or tablets containing iron may be fatal to a baby or young child when ingested.  Avoid use of all medications, including herbs, over-the-counter medications, not prescribed or suggested by your caregiver. Only take over-the-counter or prescription medicines for pain, discomfort, or fever as directed by your caregiver. Do not use aspirin.  Let your caregiver also know about herbs you may be using.  Alcohol is related to a number of birth defects. This includes fetal alcohol syndrome. All alcohol, in any form, should be avoided completely. Smoking will cause low birth rate and premature babies.  Street or illegal drugs are very harmful to the baby. They are absolutely forbidden. A baby born to an addicted mother will be addicted at birth. The baby will go through the same withdrawal an adult does. SEEK MEDICAL CARE IF:  You have any concerns or worries during your pregnancy. It is better to call with your questions if you feel they cannot wait,  rather than worry about them. SEEK IMMEDIATE MEDICAL CARE IF:   An unexplained oral temperature above 102 F (38.9 C) develops, or as your caregiver suggests.  You have leaking of fluid from the vagina (birth canal). If leaking membranes are suspected, take your temperature and tell your caregiver of this when you call.  There is vaginal spotting, bleeding, or passing clots. Tell your caregiver of the amount and how many pads are used. Light spotting in pregnancy is common, especially following intercourse.  You develop a bad smelling vaginal discharge with a change in the color from clear   to white.  You continue to feel sick to your stomach (nauseated) and have no relief from remedies suggested. You vomit blood or coffee ground-like materials.  You lose more than 2 pounds of weight or gain more than 2 pounds of weight over 1 week, or as suggested by your caregiver.  You notice swelling of your face, hands, feet, or legs.  You get exposed to German measles and have never had them.  You are exposed to fifth disease or chickenpox.  You develop belly (abdominal) pain. Round ligament discomfort is a common non-cancerous (benign) cause of abdominal pain in pregnancy. Your caregiver still must evaluate you.  You develop a bad headache that does not go away.  You develop fever, diarrhea, pain with urination, or shortness of breath.  You develop visual problems, blurry, or double vision.  You fall or are in a car accident or any kind of trauma.  There is mental or physical violence at home. Document Released: 07/19/2001 Document Revised: 10/17/2011 Document Reviewed: 01/21/2009 [employer] Patient Information 2013 [employer].  Breastfeeding Deciding to breastfeed is one of the best choices you can make for you and your baby. The information that follows gives a brief overview of the benefits of breastfeeding as well as common topics surrounding breastfeeding. BENEFITS OF  BREASTFEEDING For the baby  The first milk (colostrum) helps the baby's digestive system function better.   There are antibodies in the mother's milk that help the baby fight off infections.   The baby has a lower incidence of asthma, allergies, and sudden infant death syndrome (SIDS).   The nutrients in breast milk are better for the baby than infant formulas, and breast milk helps the baby's brain grow better.   Babies who breastfeed have less gas, colic, and constipation.  For the mother  Breastfeeding helps develop a very special bond between the mother and her baby.   Breastfeeding is convenient, always available at the correct temperature, and costs nothing.   Breastfeeding burns calories in the mother and helps her lose weight that was gained during pregnancy.   Breastfeeding makes the uterus contract back down to normal size faster and slows bleeding following delivery.   Breastfeeding mothers have a lower risk of developing breast cancer.  BREASTFEEDING FREQUENCY  A healthy, full-term baby may breastfeed as often as every hour or space his or her feedings to every 3 hours.   Watch your baby for signs of hunger. Nurse your baby if he or she shows signs of hunger. How often you nurse will vary from baby to baby.   Nurse as often as the baby requests, or when you feel the need to reduce the fullness of your breasts.   Awaken the baby if it has been 3 4 hours since the last feeding.   Frequent feeding will help the mother make more milk and will help prevent problems, such as sore nipples and engorgement of the breasts.  BABY'S POSITION AT THE BREAST  Whether lying down or sitting, be sure that the baby's tummy is facing your tummy.   Support the breast with 4 fingers underneath the breast and the thumb above. Make sure your fingers are well away from the nipple and baby's mouth.   Stroke the baby's lips gently with your finger or nipple.   When the  baby's mouth is open wide enough, place all of your nipple and as much of the areola as possible into your baby's mouth.   Pull the baby in   close so the tip of the nose and the baby's cheeks touch the breast during the feeding.  FEEDINGS AND SUCTION  The length of each feeding varies from baby to baby and from feeding to feeding.   The baby must suck about 2 3 minutes for your milk to get to him or her. This is called a "let down." For this reason, allow the baby to feed on each breast as long as he or she wants. Your baby will end the feeding when he or she has received the right balance of nutrients.   To break the suction, put your finger into the corner of the baby's mouth and slide it between his or her gums before removing your breast from his or her mouth. This will help prevent sore nipples.  HOW TO TELL WHETHER YOUR BABY IS GETTING ENOUGH BREAST MILK. Wondering whether or not your baby is getting enough milk is a common concern among mothers. You can be assured that your baby is getting enough milk if:   Your baby is actively sucking and you hear swallowing.   Your baby seems relaxed and satisfied after a feeding.   Your baby nurses at least 8 12 times in a 24 hour time period. Nurse your baby until he or she unlatches or falls asleep at the first breast (at least 10 20 minutes), then offer the second side.   Your baby is wetting 5 6 disposable diapers (6 8 cloth diapers) in a 24 hour period by 5 6 days of age.   Your baby is having at least 3 4 stools every 24 hours for the first 6 weeks. The stool should be soft and yellow.   Your baby should gain 4 7 ounces per week after he or she is 4 days old.   Your breasts feel softer after nursing.  REDUCING BREAST ENGORGEMENT  In the first week after your baby is born, you may experience signs of breast engorgement. When breasts are engorged, they feel heavy, warm, full, and may be tender to the touch. You can reduce  engorgement if you:   Nurse frequently, every 2 3 hours. Mothers who breastfeed early and often have fewer problems with engorgement.   Place light ice packs on your breasts for 10 20 minutes between feedings. This reduces swelling. Wrap the ice packs in a lightweight towel to protect your skin. Bags of frozen vegetables work well for this purpose.   Take a warm shower or apply warm, moist heat to your breast for 5 10 minutes just before each feeding. This increases circulation and helps the milk flow.   Gently massage your breast before and during the feeding. Using your finger tips, massage from the chest wall towards your nipple in a circular motion.   Make sure that the baby empties at least one breast at every feeding before switching sides.   Use a breast pump to empty the breasts if your baby is sleepy or not nursing well. You may also want to pump if you are returning to work oryou feel you are getting engorged.   Avoid bottle feeds, pacifiers, or supplemental feedings of water or juice in place of breastfeeding. Breast milk is all the food your baby needs. It is not necessary for your baby to have water or formula. In fact, to help your breasts make more milk, it is best not to give your baby supplemental feedings during the early weeks.   Be sure the baby is latched   on and positioned properly while breastfeeding.   Wear a supportive bra, avoiding underwire styles.   Eat a balanced diet with enough fluids.   Rest often, relax, and take your prenatal vitamins to prevent fatigue, stress, and anemia.  If you follow these suggestions, your engorgement should improve in 24 48 hours. If you are still experiencing difficulty, call your lactation consultant or caregiver.  CARING FOR YOURSELF Take care of your breasts  Bathe or shower daily.   Avoid using soap on your nipples.   Start feedings on your left breast at one feeding and on your right breast at the next  feeding.   You will notice an increase in your milk supply 2 5 days after delivery. You may feel some discomfort from engorgement, which makes your breasts very firm and often tender. Engorgement "peaks" out within 24 48 hours. In the meantime, apply warm moist towels to your breasts for 5 10 minutes before feeding. Gentle massage and expression of some milk before feeding will soften your breasts, making it easier for your baby to latch on.   Wear a well-fitting nursing bra, and air dry your nipples for a 3 4minutes after each feeding.   Only use cotton bra pads.   Only use pure lanolin on your nipples after nursing. You do not need to wash it off before feeding the baby again. Another option is to express a few drops of breast milk and gently massage it into your nipples.  Take care of yourself  Eat well-balanced meals and nutritious snacks.   Drinking milk, fruit juice, and water to satisfy your thirst (about 8 glasses a day).   Get plenty of rest.  Avoid foods that you notice affect the baby in a bad way.  SEEK MEDICAL CARE IF:   You have difficulty with breastfeeding and need help.   You have a hard, red, sore area on your breast that is accompanied by a fever.   Your baby is too sleepy to eat well or is having trouble sleeping.   Your baby is wetting less than 6 diapers a day, by 5 days of age.   Your baby's skin or white part of his or her eyes is more yellow than it was in the hospital.   You feel depressed.  Document Released: 07/25/2005 Document Revised: 01/24/2012 Document Reviewed: 10/23/2011 [employer] Patient Information 2013 [employer].  

## 2012-08-08 NOTE — L&D Delivery Note (Signed)
Delivery Note At 1:17 AM a viable female was delivered via Vaginal, Spontaneous Delivery (Presentation: ; Occiput Anterior).  APGAR: 8, 9; weight .   Placenta status: Intact, Spontaneous.  Cord: 3 vessels with the following complications: None.   Anesthesia: Epidural  Episiotomy: None Lacerations: None Est. Blood Loss (mL):  100  Mom to postpartum.  Baby to nursery-stable.  MERRELL, DAVID, MD Family Medicine Resident PGY-2 11/08/2012, 1:44 AM  I saw and examined patient along with student and agree with above note.   FRAZIER,NATALIE 11/11/2012 9:46 AM

## 2012-08-08 NOTE — L&D Delivery Note (Signed)
Attestation of Attending Supervision of Advanced Practitioner: Evaluation and management procedures were performed by the PA/NP/CNM/OB Fellow under my supervision/collaboration. Chart reviewed and agree with management and plan.  Tilda Burrow 11/12/2012 5:37 PM

## 2012-09-04 ENCOUNTER — Encounter: Payer: 59 | Admitting: Obstetrics & Gynecology

## 2012-09-07 ENCOUNTER — Ambulatory Visit (INDEPENDENT_AMBULATORY_CARE_PROVIDER_SITE_OTHER): Payer: 59 | Admitting: Obstetrics & Gynecology

## 2012-09-07 VITALS — BP 114/71 | Wt 176.0 lb

## 2012-09-07 DIAGNOSIS — Z3490 Encounter for supervision of normal pregnancy, unspecified, unspecified trimester: Secondary | ICD-10-CM

## 2012-09-07 DIAGNOSIS — Z3480 Encounter for supervision of other normal pregnancy, unspecified trimester: Secondary | ICD-10-CM

## 2012-09-07 DIAGNOSIS — O26899 Other specified pregnancy related conditions, unspecified trimester: Secondary | ICD-10-CM | POA: Insufficient documentation

## 2012-09-07 DIAGNOSIS — Z6791 Unspecified blood type, Rh negative: Secondary | ICD-10-CM

## 2012-09-07 DIAGNOSIS — O360990 Maternal care for other rhesus isoimmunization, unspecified trimester, not applicable or unspecified: Secondary | ICD-10-CM

## 2012-09-07 DIAGNOSIS — O09529 Supervision of elderly multigravida, unspecified trimester: Secondary | ICD-10-CM

## 2012-09-07 MED ORDER — RHO D IMMUNE GLOBULIN 1500 UNIT/2ML IJ SOLN
300.0000 ug | Freq: Once | INTRAMUSCULAR | Status: AC
  Administered 2012-09-07: 300 ug via INTRAMUSCULAR

## 2012-09-07 NOTE — Patient Instructions (Signed)
Return to clinic for any obstetric concerns or go to [facility] for evaluation  

## 2012-09-07 NOTE — Progress Notes (Signed)
1 hr GTT and third trimester labs today. Rhogam also given. Declined TDap today, will consider this later. No other complaints or concerns.  Fetal movement and labor precautions reviewed.

## 2012-09-08 LAB — CBC
HCT: 30.9 % — ABNORMAL LOW (ref 36.0–46.0)
Platelets: 263 10*3/uL (ref 150–400)
RDW: 14.3 % (ref 11.5–15.5)
WBC: 9.9 10*3/uL (ref 4.0–10.5)

## 2012-09-08 LAB — GLUCOSE TOLERANCE, 1 HOUR (50G) W/O FASTING: Glucose, 1 Hour GTT: 142 mg/dL — ABNORMAL HIGH (ref 70–140)

## 2012-09-08 LAB — RPR

## 2012-09-10 ENCOUNTER — Encounter: Payer: Self-pay | Admitting: Obstetrics & Gynecology

## 2012-09-21 ENCOUNTER — Encounter: Payer: 59 | Admitting: Obstetrics and Gynecology

## 2012-09-25 ENCOUNTER — Encounter: Payer: Self-pay | Admitting: Family Medicine

## 2012-09-25 ENCOUNTER — Ambulatory Visit (INDEPENDENT_AMBULATORY_CARE_PROVIDER_SITE_OTHER): Payer: 59 | Admitting: Family Medicine

## 2012-09-25 VITALS — BP 96/68 | Wt 177.0 lb

## 2012-09-25 DIAGNOSIS — Z3480 Encounter for supervision of other normal pregnancy, unspecified trimester: Secondary | ICD-10-CM

## 2012-09-25 DIAGNOSIS — Z3490 Encounter for supervision of normal pregnancy, unspecified, unspecified trimester: Secondary | ICD-10-CM

## 2012-09-25 DIAGNOSIS — Z23 Encounter for immunization: Secondary | ICD-10-CM

## 2012-09-25 DIAGNOSIS — O99810 Abnormal glucose complicating pregnancy: Secondary | ICD-10-CM

## 2012-09-25 MED ORDER — TETANUS-DIPHTH-ACELL PERTUSSIS 5-2.5-18.5 LF-MCG/0.5 IM SUSP: 0.5000 mL | Freq: Once | INTRAMUSCULAR | Status: DC

## 2012-09-25 NOTE — Progress Notes (Signed)
3 hour today and TDaP

## 2012-09-25 NOTE — Patient Instructions (Signed)
Pregnancy - Third Trimester  The third trimester of pregnancy (the last 3 months) is a period of the most rapid growth for you and your baby. The baby approaches a length of 20 inches and a weight of 6 to 10 pounds. The baby is adding on fat and getting ready for life outside your body. While inside, babies have periods of sleeping and waking, suck their thumbs, and hiccups. You can often feel small contractions of the uterus. This is false labor. It is also called Braxton-Hicks contractions. This is like a practice for labor. The usual problems in this stage of pregnancy include more difficulty breathing, swelling of the hands and feet from water retention, and having to urinate more often because of the uterus and baby pressing on your bladder.   PRENATAL EXAMS  · Blood work may continue to be done during prenatal exams. These tests are done to check on your health and the probable health of your baby. Blood work is used to follow your blood levels (hemoglobin). Anemia (low hemoglobin) is common during pregnancy. Iron and vitamins are given to help prevent this. You may also continue to be checked for diabetes. Some of the past blood tests may be done again.  · The size of the uterus is measured during each visit. This makes sure your baby is growing properly according to your pregnancy dates.  · Your blood pressure is checked every prenatal visit. This is to make sure you are not getting toxemia.  · Your urine is checked every prenatal visit for infection, diabetes and protein.  · Your weight is checked at each visit. This is done to make sure gains are happening at the suggested rate and that you and your baby are growing normally.  · Sometimes, an ultrasound is performed to confirm the position and the proper growth and development of the baby. This is a test done that bounces harmless sound waves off the baby so your caregiver can more accurately determine due dates.  · Discuss the type of pain medication and  anesthesia you will have during your labor and delivery.  · Discuss the possibility and anesthesia if a Cesarean Section might be necessary.  · Inform your caregiver if there is any mental or physical violence at home.  Sometimes, a specialized non-stress test, contraction stress test and biophysical profile are done to make sure the baby is not having a problem. Checking the amniotic fluid surrounding the baby is called an amniocentesis. The amniotic fluid is removed by sticking a needle into the belly (abdomen). This is sometimes done near the end of pregnancy if an early delivery is required. In this case, it is done to help make sure the baby's lungs are mature enough for the baby to live outside of the womb. If the lungs are not mature and it is unsafe to deliver the baby, an injection of cortisone medication is given to the mother 1 to 2 days before the delivery. This helps the baby's lungs mature and makes it safer to deliver the baby.  CHANGES OCCURING IN THE THIRD TRIMESTER OF PREGNANCY  Your body goes through many changes during pregnancy. They vary from person to person. Talk to your caregiver about changes you notice and are concerned about.  · During the last trimester, you have probably had an increase in your appetite. It is normal to have cravings for certain foods. This varies from person to person and pregnancy to pregnancy.  · You may begin to   get stretch marks on your hips, abdomen, and breasts. These are normal changes in the body during pregnancy. There are no exercises or medications to take which prevent this change.  · Constipation may be treated with a stool softener or adding bulk to your diet. Drinking lots of fluids, fiber in vegetables, fruits, and whole grains are helpful.  · Exercising is also helpful. If you have been very active up until your pregnancy, most of these activities can be continued during your pregnancy. If you have been less active, it is helpful to start an exercise  program such as walking. Consult your caregiver before starting exercise programs.  · Avoid all smoking, alcohol, un-prescribed drugs, herbs and "street drugs" during your pregnancy. These chemicals affect the formation and growth of the baby. Avoid chemicals throughout the pregnancy to ensure the delivery of a healthy infant.  · Backache, varicose veins and hemorrhoids may develop or get worse.  · You will tire more easily in the third trimester, which is normal.  · The baby's movements may be stronger and more often.  · You may become short of breath easily.  · Your belly button may stick out.  · A yellow discharge may leak from your breasts called colostrum.  · You may have a bloody mucus discharge. This usually occurs a few days to a week before labor begins.  HOME CARE INSTRUCTIONS   · Keep your caregiver's appointments. Follow your caregiver's instructions regarding medication use, exercise, and diet.  · During pregnancy, you are providing food for you and your baby. Continue to eat regular, well-balanced meals. Choose foods such as meat, fish, milk and other low fat dairy products, vegetables, fruits, and whole-grain breads and cereals. Your caregiver will tell you of the ideal weight gain.  · A physical sexual relationship may be continued throughout pregnancy if there are no other problems such as early (premature) leaking of amniotic fluid from the membranes, vaginal bleeding, or belly (abdominal) pain.  · Exercise regularly if there are no restrictions. Check with your caregiver if you are unsure of the safety of your exercises. Greater weight gain will occur in the last 2 trimesters of pregnancy. Exercising helps:  · Control your weight.  · Get you in shape for labor and delivery.  · You lose weight after you deliver.  · Rest a lot with legs elevated, or as needed for leg cramps or low back pain.  · Wear a good support or jogging bra for breast tenderness during pregnancy. This may help if worn during  sleep. Pads or tissues may be used in the bra if you are leaking colostrum.  · Do not use hot tubs, steam rooms, or saunas.  · Wear your seat belt when driving. This protects you and your baby if you are in an accident.  · Avoid raw meat, cat litter boxes and soil used by cats. These carry germs that can cause birth defects in the baby.  · It is easier to loose urine during pregnancy. Tightening up and strengthening the pelvic muscles will help with this problem. You can practice stopping your urination while you are going to the bathroom. These are the same muscles you need to strengthen. It is also the muscles you would use if you were trying to stop from passing gas. You can practice tightening these muscles up 10 times a set and repeating this about 3 times per day. Once you know what muscles to tighten up, do not perform these   exercises during urination. It is more likely to cause an infection by backing up the urine.  · Ask for help if you have financial, counseling or nutritional needs during pregnancy. Your caregiver will be able to offer counseling for these needs as well as refer you for other special needs.  · Make a list of emergency phone numbers and have them available.  · Plan on getting help from family or friends when you go home from the hospital.  · Make a trial run to the hospital.  · Take prenatal classes with the father to understand, practice and ask questions about the labor and delivery.  · Prepare the baby's room/nursery.  · Do not travel out of the city unless it is absolutely necessary and with the advice of your caregiver.  · Wear only low or no heal shoes to have better balance and prevent falling.  MEDICATIONS AND DRUG USE IN PREGNANCY  · Take prenatal vitamins as directed. The vitamin should contain 1 milligram of folic acid. Keep all vitamins out of reach of children. Only a couple vitamins or tablets containing iron may be fatal to a baby or young child when ingested.  · Avoid use  of all medications, including herbs, over-the-counter medications, not prescribed or suggested by your caregiver. Only take over-the-counter or prescription medicines for pain, discomfort, or fever as directed by your caregiver. Do not use aspirin, ibuprofen (Motrin®, Advil®, Nuprin®) or naproxen (Aleve®) unless OK'd by your caregiver.  · Let your caregiver also know about herbs you may be using.  · Alcohol is related to a number of birth defects. This includes fetal alcohol syndrome. All alcohol, in any form, should be avoided completely. Smoking will cause low birth rate and premature babies.  · Street/illegal drugs are very harmful to the baby. They are absolutely forbidden. A baby born to an addicted mother will be addicted at birth. The baby will go through the same withdrawal an adult does.  SEEK MEDICAL CARE IF:  You have any concerns or worries during your pregnancy. It is better to call with your questions if you feel they cannot wait, rather than worry about them.  DECISIONS ABOUT CIRCUMCISION  You may or may not know the sex of your baby. If you know your baby is a boy, it may be time to think about circumcision. Circumcision is the removal of the foreskin of the penis. This is the skin that covers the sensitive end of the penis. There is no proven medical need for this. Often this decision is made on what is popular at the time or based upon religious beliefs and social issues. You can discuss these issues with your caregiver or pediatrician.  SEEK IMMEDIATE MEDICAL CARE IF:   · An unexplained oral temperature above 102° F (38.9° C) develops, or as your caregiver suggests.  · You have leaking of fluid from the vagina (birth canal). If leaking membranes are suspected, take your temperature and tell your caregiver of this when you call.  · There is vaginal spotting, bleeding or passing clots. Tell your caregiver of the amount and how many pads are used.  · You develop a bad smelling vaginal discharge with  a change in the color from clear to white.  · You develop vomiting that lasts more than 24 hours.  · You develop chills or fever.  · You develop shortness of breath.  · You develop burning on urination.  · You loose more than 2 pounds of weight   or gain more than 2 pounds of weight or as suggested by your caregiver.  · You notice sudden swelling of your face, hands, and feet or legs.  · You develop belly (abdominal) pain. Round ligament discomfort is a common non-cancerous (benign) cause of abdominal pain in pregnancy. Your caregiver still must evaluate you.  · You develop a severe headache that does not go away.  · You develop visual problems, blurred or double vision.  · If you have not felt your baby move for more than 1 hour. If you think the baby is not moving as much as usual, eat something with sugar in it and lie down on your left side for an hour. The baby should move at least 4 to 5 times per hour. Call right away if your baby moves less than that.  · You fall, are in a car accident or any kind of trauma.  · There is mental or physical violence at home.  Document Released: 07/19/2001 Document Revised: 10/17/2011 Document Reviewed: 01/21/2009  [employer]® Patient Information ©2013 [employer].

## 2012-09-26 ENCOUNTER — Encounter: Payer: Self-pay | Admitting: Family Medicine

## 2012-09-26 LAB — GLUCOSE TOLERANCE, 3 HOURS
Glucose Tolerance, 1 hour: 129 mg/dL (ref 70–189)
Glucose Tolerance, Fasting: 66 mg/dL — ABNORMAL LOW (ref 70–104)

## 2012-10-09 ENCOUNTER — Encounter: Payer: 59 | Admitting: Obstetrics & Gynecology

## 2012-10-10 ENCOUNTER — Encounter: Payer: Self-pay | Admitting: Obstetrics and Gynecology

## 2012-10-10 ENCOUNTER — Ambulatory Visit (INDEPENDENT_AMBULATORY_CARE_PROVIDER_SITE_OTHER): Payer: 59 | Admitting: Obstetrics and Gynecology

## 2012-10-10 VITALS — BP 105/90 | Wt 184.0 lb

## 2012-10-10 DIAGNOSIS — O360131 Maternal care for anti-D [Rh] antibodies, third trimester, fetus 1: Secondary | ICD-10-CM

## 2012-10-10 DIAGNOSIS — Z3480 Encounter for supervision of other normal pregnancy, unspecified trimester: Secondary | ICD-10-CM

## 2012-10-10 DIAGNOSIS — O09529 Supervision of elderly multigravida, unspecified trimester: Secondary | ICD-10-CM

## 2012-10-10 DIAGNOSIS — O360990 Maternal care for other rhesus isoimmunization, unspecified trimester, not applicable or unspecified: Secondary | ICD-10-CM

## 2012-10-10 NOTE — Progress Notes (Signed)
Patient doing well without complaints. FM/PTL precautions reviewed. Cultures next visit

## 2012-10-25 ENCOUNTER — Ambulatory Visit (INDEPENDENT_AMBULATORY_CARE_PROVIDER_SITE_OTHER): Payer: 59 | Admitting: Obstetrics & Gynecology

## 2012-10-25 VITALS — BP 102/76 | Wt 183.0 lb

## 2012-10-25 DIAGNOSIS — O09529 Supervision of elderly multigravida, unspecified trimester: Secondary | ICD-10-CM

## 2012-10-25 DIAGNOSIS — O09523 Supervision of elderly multigravida, third trimester: Secondary | ICD-10-CM

## 2012-10-25 NOTE — Patient Instructions (Signed)
Return to clinic for any obstetric concerns or go to [facility] for evaluation  

## 2012-10-25 NOTE — Progress Notes (Addendum)
Pelvic cultures done.  No other complaints or concerns.  Fetal movement and labor precautions reviewed.

## 2012-10-25 NOTE — Progress Notes (Signed)
P-102 

## 2012-11-01 ENCOUNTER — Encounter: Payer: Self-pay | Admitting: Obstetrics & Gynecology

## 2012-11-01 ENCOUNTER — Ambulatory Visit (INDEPENDENT_AMBULATORY_CARE_PROVIDER_SITE_OTHER): Payer: 59 | Admitting: Family Medicine

## 2012-11-01 VITALS — BP 136/83 | Wt 185.0 lb

## 2012-11-01 DIAGNOSIS — Z3493 Encounter for supervision of normal pregnancy, unspecified, third trimester: Secondary | ICD-10-CM

## 2012-11-01 DIAGNOSIS — Z3480 Encounter for supervision of other normal pregnancy, unspecified trimester: Secondary | ICD-10-CM

## 2012-11-01 DIAGNOSIS — O99820 Streptococcus B carrier state complicating pregnancy: Secondary | ICD-10-CM | POA: Insufficient documentation

## 2012-11-01 NOTE — Assessment & Plan Note (Signed)
Doing well 

## 2012-11-01 NOTE — Progress Notes (Signed)
P - 86 - Pt wants to be checked and would like to talk about membrane sweeping next visit but not today

## 2012-11-01 NOTE — Patient Instructions (Addendum)
Breastfeeding Deciding to breastfeed is one of the best choices you can make for you and your baby. The information that follows gives a brief overview of the benefits of breastfeeding as well as common topics surrounding breastfeeding. BENEFITS OF BREASTFEEDING For the baby  The first milk (colostrum) helps the baby's digestive system function better.   There are antibodies in the mother's milk that help the baby fight off infections.   The baby has a lower incidence of asthma, allergies, and sudden infant death syndrome (SIDS).   The nutrients in breast milk are better for the baby than infant formulas, and breast milk helps the baby's brain grow better.   Babies who breastfeed have less gas, colic, and constipation.  For the mother  Breastfeeding helps develop a very special bond between the mother and her baby.   Breastfeeding is convenient, always available at the correct temperature, and costs nothing.   Breastfeeding burns calories in the mother and helps her lose weight that was gained during pregnancy.   Breastfeeding makes the uterus contract back down to normal size faster and slows bleeding following delivery.   Breastfeeding mothers have a lower risk of developing breast cancer.  BREASTFEEDING FREQUENCY  A healthy, full-term baby may breastfeed as often as every hour or space his or her feedings to every 3 hours.   Watch your baby for signs of hunger. Nurse your baby if he or she shows signs of hunger. How often you nurse will vary from baby to baby.   Nurse as often as the baby requests, or when you feel the need to reduce the fullness of your breasts.   Awaken the baby if it has been 3 4 hours since the last feeding.   Frequent feeding will help the mother make more milk and will help prevent problems, such as sore nipples and engorgement of the breasts.  BABY'S POSITION AT THE BREAST  Whether lying down or sitting, be sure that the baby's tummy  is facing your tummy.   Support the breast with 4 fingers underneath the breast and the thumb above. Make sure your fingers are well away from the nipple and baby's mouth.   Stroke the baby's lips gently with your finger or nipple.   When the baby's mouth is open wide enough, place all of your nipple and as much of the areola as possible into your baby's mouth.   Pull the baby in close so the tip of the nose and the baby's cheeks touch the breast during the feeding.  FEEDINGS AND SUCTION  The length of each feeding varies from baby to baby and from feeding to feeding.   The baby must suck about 2 3 minutes for your milk to get to him or her. This is called a "let down." For this reason, allow the baby to feed on each breast as long as he or she wants. Your baby will end the feeding when he or she has received the right balance of nutrients.   To break the suction, put your finger into the corner of the baby's mouth and slide it between his or her gums before removing your breast from his or her mouth. This will help prevent sore nipples.  HOW TO TELL WHETHER YOUR BABY IS GETTING ENOUGH BREAST MILK. Wondering whether or not your baby is getting enough milk is a common concern among mothers. You can be assured that your baby is getting enough milk if:   Your baby is actively  sucking and you hear swallowing.   Your baby seems relaxed and satisfied after a feeding.   Your baby nurses at least 8 12 times in a 24 hour time period. Nurse your baby until he or she unlatches or falls asleep at the first breast (at least 10 20 minutes), then offer the second side.   Your baby is wetting 5 6 disposable diapers (6 8 cloth diapers) in a 24 hour period by 33 82 days of age.   Your baby is having at least 3 4 stools every 24 hours for the first 6 weeks. The stool should be soft and yellow.   Your baby should gain 4 7 ounces per week after he or she is 12 days old.   Your breasts feel  softer after nursing.  REDUCING BREAST ENGORGEMENT  In the first week after your baby is born, you may experience signs of breast engorgement. When breasts are engorged, they feel heavy, warm, full, and may be tender to the touch. You can reduce engorgement if you:   Nurse frequently, every 2 3 hours. Mothers who breastfeed early and often have fewer problems with engorgement.   Place light ice packs on your breasts for 10 20 minutes between feedings. This reduces swelling. Wrap the ice packs in a lightweight towel to protect your skin. Bags of frozen vegetables work well for this purpose.   Take a warm shower or apply warm, moist heat to your breast for 5 10 minutes just before each feeding. This increases circulation and helps the milk flow.   Gently massage your breast before and during the feeding. Using your finger tips, massage from the chest wall towards your nipple in a circular motion.   Make sure that the baby empties at least one breast at every feeding before switching sides.   Use a breast pump to empty the breasts if your baby is sleepy or not nursing well. You may also want to pump if you are returning to work oryou feel you are getting engorged.   Avoid bottle feeds, pacifiers, or supplemental feedings of water or juice in place of breastfeeding. Breast milk is all the food your baby needs. It is not necessary for your baby to have water or formula. In fact, to help your breasts make more milk, it is best not to give your baby supplemental feedings during the early weeks.   Be sure the baby is latched on and positioned properly while breastfeeding.   Wear a supportive bra, avoiding underwire styles.   Eat a balanced diet with enough fluids.   Rest often, relax, and take your prenatal vitamins to prevent fatigue, stress, and anemia.  If you follow these suggestions, your engorgement should improve in 24 48 hours. If you are still experiencing difficulty, call  your lactation consultant or caregiver.  CARING FOR YOURSELF Take care of your breasts  Bathe or shower daily.   Avoid using soap on your nipples.   Start feedings on your left breast at one feeding and on your right breast at the next feeding.   You will notice an increase in your milk supply 2 5 days after delivery. You may feel some discomfort from engorgement, which makes your breasts very firm and often tender. Engorgement "peaks" out within 24 48 hours. In the meantime, apply warm moist towels to your breasts for 5 10 minutes before feeding. Gentle massage and expression of some milk before feeding will soften your breasts, making it easier for your  baby to latch on.   Wear a well-fitting nursing bra, and air dry your nipples for a 3 after each feeding.   Only use cotton bra pads.   Only use pure lanolin on your nipples after nursing. You do not need to wash it off before feeding the baby again. Another option is to express a few drops of breast milk and gently massage it into your nipples.  Take care of yourself  Eat well-balanced meals and nutritious snacks.   Drinking milk, fruit juice, and water to satisfy your thirst (about 8 glasses a day).   Get plenty of rest.  Avoid foods that you notice affect the baby in a bad way.  SEEK MEDICAL CARE IF:   You have difficulty with breastfeeding and need help.   You have a hard, red, sore area on your breast that is accompanied by a fever.   Your baby is too sleepy to eat well or is having trouble sleeping.   Your baby is wetting less than 6 diapers a day, by 40 days of age.   Your baby's skin or white part of his or her eyes is more yellow than it was in the hospital.   You feel depressed.  Document Released: 07/25/2005 Document Revised: 01/24/2012 Document Reviewed: 10/23/2011 [facility] Patient Information 2013 [place], [place]. Normal Labor and Delivery Your caregiver must first be sure you are in  labor. Signs of labor include:  You may pass what is called "the mucus plug" before labor begins. This is a small amount of blood stained mucus.  Regular uterine contractions.  The time between contractions get closer together.  The discomfort and pain gradually gets more intense.  Pains are mostly located in the back.  Pains get worse when walking.  The cervix (the opening of the uterus becomes thinner (begins to efface) and opens up (dilates). Once you are in labor and admitted into the hospital or care center, your caregiver will do the following:  A complete physical examination.  Check your vital signs (blood pressure, pulse, temperature and the fetal heart rate).  Do a vaginal examination (using a sterile glove and lubricant) to determine:  The position (presentation) of the baby (head [vertex] or buttock first).  The level (station) of the baby's head in the birth canal.  The effacement and dilatation of the cervix.  You may have your pubic hair shaved and be given an enema depending on your caregiver and the circumstance.  An electronic monitor is usually placed on your abdomen. The monitor follows the length and intensity of the contractions, as well as the baby's heart rate.  Usually, your caregiver will insert an IV in your arm with a bottle of sugar water. This is done as a precaution so that medications can be given to you quickly during labor or delivery. NORMAL LABOR AND DELIVERY IS DIVIDED UP INTO 3 STAGES: First Stage This is when regular contractions begin and the cervix begins to efface and dilate. This stage can last from 3 to 15 hours. The end of the first stage is when the cervix is 100% effaced and 10 centimeters dilated. Pain medications may be given by   Injection (morphine, demerol, etc.)  Regional anesthesia (spinal, caudal or epidural, anesthetics given in different locations of the spine). Paracervical pain medication may be given, which is an  injection of and anesthetic on each side of the cervix. A pregnant woman may request to have "Natural Childbirth" which is not to have  any medications or anesthesia during her labor and delivery. Second Stage This is when the baby comes down through the birth canal (vagina) and is born. This can take 1 to 4 hours. As the baby's head comes down through the birth canal, you may feel like you are going to have a bowel movement. You will get the urge to bear down and push until the baby is delivered. As the baby's head is being delivered, the caregiver will decide if an episiotomy (a cut in the perineum and vagina area) is needed to prevent tearing of the tissue in this area. The episiotomy is sewn up after the delivery of the baby and placenta. Sometimes a mask with nitrous oxide is given for the mother to breath during the delivery of the baby to help if there is too much pain. The end of Stage 2 is when the baby is fully delivered. Then when the umbilical cord stops pulsating it is clamped and cut. Third Stage The third stage begins after the baby is completely delivered and ends after the placenta (afterbirth) is delivered. This usually takes 5 to 30 minutes. After the placenta is delivered, a medication is given either by intravenous or injection to help contract the uterus and prevent bleeding. The third stage is not painful and pain medication is usually not necessary. If an episiotomy was done, it is repaired at this time. After the delivery, the mother is watched and monitored closely for 1 to 2 hours to make sure there is no postpartum bleeding (hemorrhage). If there is a lot of bleeding, medication is given to contract the uterus and stop the bleeding. Document Released: 05/03/2008 Document Revised: 10/17/2011 Document Reviewed: 05/03/2008 [facility] Patient Information 2013 [place], [place].

## 2012-11-01 NOTE — Progress Notes (Signed)
Quick Note:  GBS positive. Will treat in labor. ______

## 2012-11-01 NOTE — Progress Notes (Signed)
Discussed membranes sweeping for next week.  Watch BP

## 2012-11-06 ENCOUNTER — Encounter: Payer: Self-pay | Admitting: Family Medicine

## 2012-11-06 ENCOUNTER — Ambulatory Visit (INDEPENDENT_AMBULATORY_CARE_PROVIDER_SITE_OTHER): Payer: 59 | Admitting: Family Medicine

## 2012-11-06 VITALS — BP 114/76 | Wt 185.0 lb

## 2012-11-06 DIAGNOSIS — Z3493 Encounter for supervision of normal pregnancy, unspecified, third trimester: Secondary | ICD-10-CM

## 2012-11-06 DIAGNOSIS — Z3480 Encounter for supervision of other normal pregnancy, unspecified trimester: Secondary | ICD-10-CM

## 2012-11-06 NOTE — Progress Notes (Signed)
Membranes stripped  

## 2012-11-06 NOTE — Patient Instructions (Signed)
Breastfeeding Deciding to breastfeed is one of the best choices you can make for you and your baby. The information that follows gives a brief overview of the benefits of breastfeeding as well as common topics surrounding breastfeeding. BENEFITS OF BREASTFEEDING For the baby  The first milk (colostrum) helps the baby's digestive system function better.   There are antibodies in the mother's milk that help the baby fight off infections.   The baby has a lower incidence of asthma, allergies, and sudden infant death syndrome (SIDS).   The nutrients in breast milk are better for the baby than infant formulas, and breast milk helps the baby's brain grow better.   Babies who breastfeed have less gas, colic, and constipation.  For the mother  Breastfeeding helps develop a very special bond between the mother and her baby.   Breastfeeding is convenient, always available at the correct temperature, and costs nothing.   Breastfeeding burns calories in the mother and helps her lose weight that was gained during pregnancy.   Breastfeeding makes the uterus contract back down to normal size faster and slows bleeding following delivery.   Breastfeeding mothers have a lower risk of developing breast cancer.  BREASTFEEDING FREQUENCY  A healthy, full-term baby may breastfeed as often as every hour or space his or her feedings to every 3 hours.   Watch your baby for signs of hunger. Nurse your baby if he or she shows signs of hunger. How often you nurse will vary from baby to baby.   Nurse as often as the baby requests, or when you feel the need to reduce the fullness of your breasts.   Awaken the baby if it has been 3 4 hours since the last feeding.   Frequent feeding will help the mother make more milk and will help prevent problems, such as sore nipples and engorgement of the breasts.  BABY'S POSITION AT THE BREAST  Whether lying down or sitting, be sure that the baby's tummy is  facing your tummy.   Support the breast with 4 fingers underneath the breast and the thumb above. Make sure your fingers are well away from the nipple and baby's mouth.   Stroke the baby's lips gently with your finger or nipple.   When the baby's mouth is open wide enough, place all of your nipple and as much of the areola as possible into your baby's mouth.   Pull the baby in close so the tip of the nose and the baby's cheeks touch the breast during the feeding.  FEEDINGS AND SUCTION  The length of each feeding varies from baby to baby and from feeding to feeding.   The baby must suck about 2 3 minutes for your milk to get to him or her. This is called a "let down." For this reason, allow the baby to feed on each breast as long as he or she wants. Your baby will end the feeding when he or she has received the right balance of nutrients.   To break the suction, put your finger into the corner of the baby's mouth and slide it between his or her gums before removing your breast from his or her mouth. This will help prevent sore nipples.  HOW TO TELL WHETHER YOUR BABY IS GETTING ENOUGH BREAST MILK. Wondering whether or not your baby is getting enough milk is a common concern among mothers. You can be assured that your baby is getting enough milk if:   Your baby is actively   sucking and you hear swallowing.   Your baby seems relaxed and satisfied after a feeding.   Your baby nurses at least 8 12 times in a 24 hour time period. Nurse your baby until he or she unlatches or falls asleep at the first breast (at least 10 20 minutes), then offer the second side.   Your baby is wetting 5 6 disposable diapers (6 8 cloth diapers) in a 24 hour period by 5 6 days of age.   Your baby is having at least 3 4 stools every 24 hours for the first 6 weeks. The stool should be soft and yellow.   Your baby should gain 4 7 ounces per week after he or she is 4 days old.   Your breasts feel softer  after nursing.  REDUCING BREAST ENGORGEMENT  In the first week after your baby is born, you may experience signs of breast engorgement. When breasts are engorged, they feel heavy, warm, full, and may be tender to the touch. You can reduce engorgement if you:   Nurse frequently, every 2 3 hours. Mothers who breastfeed early and often have fewer problems with engorgement.   Place light ice packs on your breasts for 10 20 minutes between feedings. This reduces swelling. Wrap the ice packs in a lightweight towel to protect your skin. Bags of frozen vegetables work well for this purpose.   Take a warm shower or apply warm, moist heat to your breast for 5 10 minutes just before each feeding. This increases circulation and helps the milk flow.   Gently massage your breast before and during the feeding. Using your finger tips, massage from the chest wall towards your nipple in a circular motion.   Make sure that the baby empties at least one breast at every feeding before switching sides.   Use a breast pump to empty the breasts if your baby is sleepy or not nursing well. You may also want to pump if you are returning to work oryou feel you are getting engorged.   Avoid bottle feeds, pacifiers, or supplemental feedings of water or juice in place of breastfeeding. Breast milk is all the food your baby needs. It is not necessary for your baby to have water or formula. In fact, to help your breasts make more milk, it is best not to give your baby supplemental feedings during the early weeks.   Be sure the baby is latched on and positioned properly while breastfeeding.   Wear a supportive bra, avoiding underwire styles.   Eat a balanced diet with enough fluids.   Rest often, relax, and take your prenatal vitamins to prevent fatigue, stress, and anemia.  If you follow these suggestions, your engorgement should improve in 24 48 hours. If you are still experiencing difficulty, call your  lactation consultant or caregiver.  CARING FOR YOURSELF Take care of your breasts  Bathe or shower daily.   Avoid using soap on your nipples.   Start feedings on your left breast at one feeding and on your right breast at the next feeding.   You will notice an increase in your milk supply 2 5 days after delivery. You may feel some discomfort from engorgement, which makes your breasts very firm and often tender. Engorgement "peaks" out within 24 48 hours. In the meantime, apply warm moist towels to your breasts for 5 10 minutes before feeding. Gentle massage and expression of some milk before feeding will soften your breasts, making it easier for your   baby to latch on.   Wear a well-fitting nursing bra, and air dry your nipples for a 3 4minutes after each feeding.   Only use cotton bra pads.   Only use pure lanolin on your nipples after nursing. You do not need to wash it off before feeding the baby again. Another option is to express a few drops of breast milk and gently massage it into your nipples.  Take care of yourself  Eat well-balanced meals and nutritious snacks.   Drinking milk, fruit juice, and water to satisfy your thirst (about 8 glasses a day).   Get plenty of rest.  Avoid foods that you notice affect the baby in a bad way.  SEEK MEDICAL CARE IF:   You have difficulty with breastfeeding and need help.   You have a hard, red, sore area on your breast that is accompanied by a fever.   Your baby is too sleepy to eat well or is having trouble sleeping.   Your baby is wetting less than 6 diapers a day, by 5 days of age.   Your baby's skin or white part of his or her eyes is more yellow than it was in the hospital.   You feel depressed.  Document Released: 07/25/2005 Document Revised: 01/24/2012 Document Reviewed: 10/23/2011 [employer] Patient Information 2013 [employer].  

## 2012-11-06 NOTE — Assessment & Plan Note (Signed)
Doing well 

## 2012-11-07 ENCOUNTER — Inpatient Hospital Stay (HOSPITAL_COMMUNITY)
Admission: AD | Admit: 2012-11-07 | Discharge: 2012-11-09 | DRG: 775 | Disposition: A | Discharge status: Home / Self Care | Payer: 59 | Source: Ambulatory Visit | Attending: Obstetrics and Gynecology | Admitting: Obstetrics and Gynecology

## 2012-11-07 ENCOUNTER — Encounter (HOSPITAL_COMMUNITY): Payer: Self-pay | Admitting: *Deleted

## 2012-11-07 ENCOUNTER — Inpatient Hospital Stay (HOSPITAL_COMMUNITY): Payer: 59 | Admitting: Anesthesiology

## 2012-11-07 ENCOUNTER — Encounter (HOSPITAL_COMMUNITY): Payer: Self-pay | Admitting: Anesthesiology

## 2012-11-07 DIAGNOSIS — O09529 Supervision of elderly multigravida, unspecified trimester: Secondary | ICD-10-CM | POA: Diagnosis present

## 2012-11-07 DIAGNOSIS — O479 False labor, unspecified: Secondary | ICD-10-CM

## 2012-11-07 DIAGNOSIS — O99820 Streptococcus B carrier state complicating pregnancy: Secondary | ICD-10-CM

## 2012-11-07 DIAGNOSIS — Z22330 Carrier of Group B streptococcus: Secondary | ICD-10-CM

## 2012-11-07 DIAGNOSIS — O99892 Other specified diseases and conditions complicating childbirth: Principal | ICD-10-CM | POA: Diagnosis present

## 2012-11-07 LAB — CBC
MCH: 31.1 pg (ref 26.0–34.0)
MCHC: 34.7 g/dL (ref 30.0–36.0)
Platelets: 244 10*3/uL (ref 150–400)

## 2012-11-07 MED ORDER — FENTANYL 2.5 MCG/ML BUPIVACAINE 1/10 % EPIDURAL INFUSION (WH - ANES)
14.0000 mL/h | INTRAMUSCULAR | Status: DC | PRN
  Administered 2012-11-07 – 2012-11-08 (×2): 14 mL/h via EPIDURAL
  Filled 2012-11-07 (×2): qty 125

## 2012-11-07 MED ORDER — CLINDAMYCIN PHOSPHATE 900 MG/50ML IV SOLN
900.0000 mg | Freq: Once | INTRAVENOUS | Status: AC
  Administered 2012-11-07: 900 mg via INTRAVENOUS
  Filled 2012-11-07: qty 50

## 2012-11-07 MED ORDER — PHENYLEPHRINE 40 MCG/ML (10ML) SYRINGE FOR IV PUSH (FOR BLOOD PRESSURE SUPPORT)
80.0000 ug | PREFILLED_SYRINGE | INTRAVENOUS | Status: DC | PRN
  Administered 2012-11-07: 80 ug via INTRAVENOUS
  Filled 2012-11-07: qty 2

## 2012-11-07 MED ORDER — BUTORPHANOL TARTRATE 1 MG/ML IJ SOLN
1.0000 mg | Freq: Once | INTRAMUSCULAR | Status: AC
  Administered 2012-11-07: 1 mg via INTRAMUSCULAR
  Filled 2012-11-07: qty 1

## 2012-11-07 MED ORDER — LACTATED RINGERS IV SOLN
500.0000 mL | Freq: Once | INTRAVENOUS | Status: AC
  Administered 2012-11-07: 500 mL via INTRAVENOUS

## 2012-11-07 MED ORDER — OXYCODONE-ACETAMINOPHEN 5-325 MG PO TABS: 1.0000 | ORAL_TABLET | ORAL | Status: DC | PRN

## 2012-11-07 MED ORDER — OXYTOCIN 40 UNITS IN LACTATED RINGERS INFUSION - SIMPLE MED
62.5000 mL/h | INTRAVENOUS | Status: DC
  Filled 2012-11-07: qty 1000

## 2012-11-07 MED ORDER — ONDANSETRON HCL 4 MG/2ML IJ SOLN: 4.0000 mg | Freq: Four times a day (QID) | INTRAMUSCULAR | Status: DC | PRN

## 2012-11-07 MED ORDER — EPHEDRINE 5 MG/ML INJ
10.0000 mg | INTRAVENOUS | Status: DC | PRN
  Filled 2012-11-07: qty 4
  Filled 2012-11-07: qty 2
  Filled 2012-11-07: qty 4

## 2012-11-07 MED ORDER — PHENYLEPHRINE 40 MCG/ML (10ML) SYRINGE FOR IV PUSH (FOR BLOOD PRESSURE SUPPORT)
80.0000 ug | PREFILLED_SYRINGE | INTRAVENOUS | Status: DC | PRN
  Filled 2012-11-07: qty 2
  Filled 2012-11-07: qty 5

## 2012-11-07 MED ORDER — EPHEDRINE 5 MG/ML INJ
10.0000 mg | INTRAVENOUS | Status: AC | PRN
  Administered 2012-11-07 (×2): 10 mg via INTRAVENOUS

## 2012-11-07 MED ORDER — CITRIC ACID-SODIUM CITRATE 334-500 MG/5ML PO SOLN: 30.0000 mL | ORAL | Status: DC | PRN

## 2012-11-07 MED ORDER — OXYTOCIN BOLUS FROM INFUSION
500.0000 mL | INTRAVENOUS | Status: DC
  Administered 2012-11-08: 500 mL via INTRAVENOUS

## 2012-11-07 MED ORDER — SODIUM BICARBONATE 8.4 % IV SOLN
INTRAVENOUS | Status: DC | PRN
  Administered 2012-11-07: 5 mL via EPIDURAL

## 2012-11-07 MED ORDER — LACTATED RINGERS IV SOLN: 500.0000 mL | INTRAVENOUS | Status: DC | PRN

## 2012-11-07 MED ORDER — LIDOCAINE HCL (PF) 1 % IJ SOLN
30.0000 mL | INTRAMUSCULAR | Status: DC | PRN
  Filled 2012-11-07 (×2): qty 30

## 2012-11-07 MED ORDER — IBUPROFEN 600 MG PO TABS: 600.0000 mg | ORAL_TABLET | Freq: Four times a day (QID) | ORAL | Status: DC | PRN

## 2012-11-07 MED ORDER — DIPHENHYDRAMINE HCL 50 MG/ML IJ SOLN
12.5000 mg | INTRAMUSCULAR | Status: DC | PRN
Start: 2012-11-07 — End: 2012-11-08

## 2012-11-07 MED ORDER — ACETAMINOPHEN 325 MG PO TABS: 650.0000 mg | ORAL_TABLET | ORAL | Status: DC | PRN

## 2012-11-07 MED ORDER — LACTATED RINGERS IV SOLN
INTRAVENOUS | Status: DC
  Administered 2012-11-07: 21:00:00 via INTRAVENOUS

## 2012-11-07 NOTE — Progress Notes (Addendum)
Kaytie Ratcliffe is a 37 y.o. G5P4004 at [redacted]w[redacted]d admitted for active labor  Subjective:   Objective: BP 95/60  Pulse 76  Temp(Src) 98.4 F (36.9 C) (Oral)  Resp 18  Ht 5' 3.5" (1.613 m)  Wt 82.725 kg (182 lb 6 oz)  BMI 31.8 kg/m2  SpO2 97%  LMP 02/11/2012      FHT:  FHR: 130s bpm, variability: minimal ,  accelerations:  Present,  decelerations:  Absent UC:   irregular, SVE:   Dilation: Lip/rim Effacement (%): 100 Station: +1 Exam by:: Dr. Konrad Dolores  Labs: Lab Results  Component Value Date   WBC 16.3* 11/07/2012   HGB 11.1* 11/07/2012   HCT 32.0* 11/07/2012   MCV 89.6 11/07/2012   PLT 244 11/07/2012    Assessment / Plan: Spontaneous labor, progressing normally  Labor: Progressing normally AROM now w/ light meconium Fetal Wellbeing:  Category I Pain Control:  Epidural I/D:  n/a Anticipated MOD:  NSVD  Haillee Johann, MD Family Medicine Resident PGY2 11/07/2012, 11:43 PM

## 2012-11-07 NOTE — Anesthesia Procedure Notes (Signed)

## 2012-11-07 NOTE — Anesthesia Preprocedure Evaluation (Signed)

## 2012-11-07 NOTE — MAU Note (Signed)
Pt states u/c's q3-6 minutes apart, hx rapid labor.

## 2012-11-07 NOTE — H&P (Signed)
Holly Powell is a 37 y.o. G5P4004 at [redacted]w[redacted]d presenting for painful contractions. Yesterday had her membranes stripped by Dr. Shawnie Pons and today has begun to have contractions. + fetal movement a few minutes ago. No gush of fluid but has had bloody show.  History OB History   Grav Para Term Preterm Abortions TAB SAB Ect Mult Living   5 4 4       4      Past Medical History  Diagnosis Date  . Pyelonephritis 2002    history of in2002 pregnancy   Past Surgical History  Procedure Laterality Date  . Breast enhancement surgery  2011   Family History: family history includes Diabetes in her mother; Hypertension in her mother; and Pancreatitis in her mother. Social History: denies use of tobacco, alcohol, drugs.  Prenatal Transfer Tool  Maternal Diabetes: No Genetic Screening: Normal Maternal Ultrasounds/Referrals: Normal Fetal Ultrasounds or other Referrals:  None Maternal Substance Abuse:  No Significant Maternal Medications:  None Significant Maternal Lab Results:  Lab values include: Group B Strep positive, Rh negative Other Comments:  advanced maternal age (78)  ROS see HPI  Dilation: 4.5 Effacement (%): 100 Station: -2 Exam by:: [employer] RN Height 5' 3.5" (1.613 m), weight 182 lb 6 oz (82.725 kg), last menstrual period 02/11/2012. Exam Physical Exam  Gen: NAD HEENT: MMM, normocephalic atraumatic Heart: RRR Lungs: CTAB via anterior auscultation Abd: gravid, nontender to palpation GU: normal external genitalia. See cervical exam above Ext: no pitting edema bilaterally Neuro: grossly nonfocal, speech intact  Prenatal labs: ABO, Rh: O/NEG/-- (08/28 1332) Antibody: NEG (08/28 1332) Rubella: 56.3 (08/28 1332) RPR: NON REAC (01/31 1019)  HBsAg: NEGATIVE (08/28 1332)  HIV: NON REACTIVE (01/31 1019)  GBS:   POSITIVE  Assessment/Plan:  Holly Powell is a 37 y.o. G5P4004 at [redacted]w[redacted]d in active labor. Stadol 1mg  IM given in MAU for pain. Will admit to L&D for expectant  management of labor Anticipate SVD GBS + with PCN allergy - will treat with clindamycin. Plans for epidural.   Levert Feinstein 11/07/2012, 7:02 PM

## 2012-11-08 ENCOUNTER — Encounter (HOSPITAL_COMMUNITY): Payer: Self-pay | Admitting: Family Medicine

## 2012-11-08 LAB — CBC
MCV: 89.7 fL (ref 78.0–100.0)
Platelets: 214 10*3/uL (ref 150–400)
RBC: 3.39 MIL/uL — ABNORMAL LOW (ref 3.87–5.11)
RDW: 13.2 % (ref 11.5–15.5)
WBC: 19.4 10*3/uL — ABNORMAL HIGH (ref 4.0–10.5)

## 2012-11-08 LAB — RPR: RPR Ser Ql: NONREACTIVE

## 2012-11-08 MED ORDER — OXYCODONE-ACETAMINOPHEN 5-325 MG PO TABS
1.0000 | ORAL_TABLET | ORAL | Status: DC | PRN
  Administered 2012-11-08: 1 via ORAL
  Filled 2012-11-08: qty 1

## 2012-11-08 MED ORDER — WITCH HAZEL-GLYCERIN EX PADS: 1.0000 "application " | MEDICATED_PAD | CUTANEOUS | Status: DC | PRN

## 2012-11-08 MED ORDER — TETANUS-DIPHTH-ACELL PERTUSSIS 5-2.5-18.5 LF-MCG/0.5 IM SUSP: 0.5000 mL | Freq: Once | INTRAMUSCULAR | Status: DC

## 2012-11-08 MED ORDER — PRENATAL MULTIVITAMIN CH
1.0000 | ORAL_TABLET | Freq: Every day | ORAL | Status: DC
  Administered 2012-11-08 – 2012-11-09 (×2): 1 via ORAL
  Filled 2012-11-08 (×2): qty 1

## 2012-11-08 MED ORDER — DIBUCAINE 1 % RE OINT: 1.0000 "application " | TOPICAL_OINTMENT | RECTAL | Status: DC | PRN

## 2012-11-08 MED ORDER — IBUPROFEN 600 MG PO TABS
600.0000 mg | ORAL_TABLET | Freq: Four times a day (QID) | ORAL | Status: DC
  Administered 2012-11-08 – 2012-11-09 (×6): 600 mg via ORAL
  Filled 2012-11-08 (×7): qty 1

## 2012-11-08 MED ORDER — BENZOCAINE-MENTHOL 20-0.5 % EX AERO
1.0000 "application " | INHALATION_SPRAY | CUTANEOUS | Status: DC | PRN
  Administered 2012-11-08: 1 via TOPICAL
  Filled 2012-11-08 (×2): qty 56

## 2012-11-08 MED ORDER — MISOPROSTOL 200 MCG PO TABS
ORAL_TABLET | ORAL | Status: AC
  Filled 2012-11-08: qty 4

## 2012-11-08 MED ORDER — RHO D IMMUNE GLOBULIN 1500 UNIT/2ML IJ SOLN
300.0000 ug | Freq: Once | INTRAMUSCULAR | Status: AC
  Administered 2012-11-09: 300 ug via INTRAMUSCULAR
  Filled 2012-11-08: qty 2

## 2012-11-08 MED ORDER — SENNOSIDES-DOCUSATE SODIUM 8.6-50 MG PO TABS
2.0000 | ORAL_TABLET | Freq: Every day | ORAL | Status: DC
  Administered 2012-11-08: 2 via ORAL

## 2012-11-08 MED ORDER — DIPHENHYDRAMINE HCL 25 MG PO CAPS: 25.0000 mg | ORAL_CAPSULE | Freq: Four times a day (QID) | ORAL | Status: DC | PRN

## 2012-11-08 MED ORDER — MISOPROSTOL 200 MCG PO TABS
800.0000 ug | ORAL_TABLET | Freq: Once | ORAL | Status: AC
  Administered 2012-11-08: 800 ug via RECTAL

## 2012-11-08 MED ORDER — SIMETHICONE 80 MG PO CHEW: 80.0000 mg | CHEWABLE_TABLET | ORAL | Status: DC | PRN

## 2012-11-08 MED ORDER — ZOLPIDEM TARTRATE 5 MG PO TABS: 5.0000 mg | ORAL_TABLET | Freq: Every evening | ORAL | Status: DC | PRN

## 2012-11-08 MED ORDER — ONDANSETRON HCL 4 MG/2ML IJ SOLN: 4.0000 mg | INTRAMUSCULAR | Status: DC | PRN

## 2012-11-08 MED ORDER — ONDANSETRON HCL 4 MG PO TABS: 4.0000 mg | ORAL_TABLET | ORAL | Status: DC | PRN

## 2012-11-08 MED ORDER — LANOLIN HYDROUS EX OINT: TOPICAL_OINTMENT | CUTANEOUS | Status: DC | PRN

## 2012-11-08 NOTE — H&P (Signed)
Attestation of Attending Supervision of Advanced Practitioner: Evaluation and management procedures were performed by the PA/NP/CNM/OB Fellow under my supervision/collaboration. Chart reviewed and agree with management and plan.  Mindi Akerson V 11/08/2012 1:18 AM

## 2012-11-09 MED ORDER — NORETHINDRONE 0.35 MG PO TABS: 1.0000 | ORAL_TABLET | Freq: Every day | ORAL | Status: DC

## 2012-11-09 MED ORDER — IBUPROFEN 600 MG PO TABS: 600.0000 mg | ORAL_TABLET | Freq: Four times a day (QID) | ORAL | Status: DC

## 2012-11-09 NOTE — Discharge Summary (Signed)
Attestation of Attending Supervision of Advanced Practitioner (CNM/NP): Evaluation and management procedures were performed by the Advanced Practitioner under my supervision and collaboration.  I have reviewed the Advanced Practitioner's note and chart, and I agree with the management and plan.  Ashad Fawbush 11/09/2012 7:45 PM   

## 2012-11-09 NOTE — Discharge Summary (Signed)
Obstetric Discharge Summary Reason for Admission: onset of labor Prenatal Procedures: none Intrapartum Procedures: spontaneous vaginal delivery and GBS prophylaxis Postpartum Procedures: none Complications-Operative and Postpartum: none Hemoglobin  Date Value Range Status  11/08/2012 10.3* 12.0 - 15.0 g/dL Final     HCT  Date Value Range Status  11/08/2012 30.4* 36.0 - 46.0 % Final    Physical Exam:  General: alert, cooperative and appears stated age 37: appropriate Uterine Fundus: firm DVT Evaluation: No evidence of DVT seen on physical exam.  Discharge Diagnoses: Term Pregnancy-delivered  Discharge Information: Date: 11/09/2012 Activity: unrestricted and pelvic rest Diet: routine Medications: Ibuprofen Condition: stable Instructions: refer to practice specific booklet Discharge to: home Breast feeding [place] for birth control   Newborn Data: Live born female  Birth Weight: 7 lb 9 oz (3430 g) APGAR: 8, 9  Home with mother.  MERRELL, DAVID, MD Family Medicine Resident PGY-2 11/09/2012, 7:24 AM  I examined pt and agree with documentation above and resident plan of care. [facility]

## 2012-11-09 NOTE — Anesthesia Postprocedure Evaluation (Signed)
  Anesthesia Post-op Note  Patient: Holly Powell  Procedure(s) Performed: * No procedures listed *  Patient Location: PACU and Mother/Baby  Anesthesia Type:Epidural  Level of Consciousness: awake, alert  and oriented  Airway and Oxygen Therapy: Patient Spontanous Breathing  Post-op Pain: mild  Post-op Assessment: Patient's Cardiovascular Status Stable, Respiratory Function Stable, No signs of Nausea or vomiting, Adequate PO intake, Pain level controlled, No headache, No backache, No residual numbness and No residual motor weakness  Post-op Vital Signs: stable  Complications: No apparent anesthesia complications

## 2012-11-10 LAB — RH IG WORKUP (INCLUDES ABO/RH)
ABO/RH(D): O NEG
Antibody Screen: POSITIVE
Fetal Screen: NEGATIVE
Gestational Age(Wks): 38.5

## 2012-11-12 ENCOUNTER — Telehealth: Payer: Self-pay | Admitting: *Deleted

## 2012-11-12 NOTE — Telephone Encounter (Signed)
Patient is requesting rx for all purpose nipple cream.  This can be compounded at [facility] pharmacy and has been called in for her.

## 2012-11-13 ENCOUNTER — Encounter: Payer: 59 | Admitting: Family Medicine

## 2012-11-14 ENCOUNTER — Ambulatory Visit: Payer: Self-pay | Admitting: Pediatrics

## 2012-12-25 ENCOUNTER — Ambulatory Visit (INDEPENDENT_AMBULATORY_CARE_PROVIDER_SITE_OTHER): Payer: 59 | Admitting: Obstetrics & Gynecology

## 2012-12-25 ENCOUNTER — Encounter: Payer: Self-pay | Admitting: Obstetrics & Gynecology

## 2012-12-25 DIAGNOSIS — Z309 Encounter for contraceptive management, unspecified: Secondary | ICD-10-CM

## 2012-12-25 MED ORDER — LEVONORGESTREL-ETHINYL ESTRAD 0.1-20 MG-MCG PO TABS: 1.0000 | ORAL_TABLET | Freq: Every day | ORAL | Status: DC

## 2012-12-25 NOTE — Progress Notes (Signed)
  Subjective:     Holly Powell is a 37 y.o. [id number] female who presents for a postpartum visit. She is 6 weeks postpartum following a spontaneous vaginal delivery. I have fully reviewed the prenatal and intrapartum course. The delivery was at 38 gestational weeks.  Anesthesia: epidural. Postpartum course has been uncomplicated. Baby's course has been uncomplicated. Baby is feeding by formula. Bleeding no bleeding. Bowel function is normal. Bladder function is normal. Patient is sexually active, no concerns. Contraception method is OCP (estrogen/progesterone). Postpartum depression screening: negative.  The following portions of the patient's history were reviewed and updated as appropriate: allergies, current medications, past family history, past medical history, past social history, past surgical history and problem list. Pap smear 04/04/12 normal.  Review of Systems Pertinent items are noted in HPI.   Objective:    BP 121/77  Pulse 67  Ht 5\' 3"  (1.6 m)  Wt 166 lb (75.297 kg)  BMI 29.41 kg/m2  Breastfeeding? No  General:  alert and no distress   Breasts:  deferred  Lungs: clear to auscultation bilaterally  Heart:  regular rate and rhythm  Abdomen: soft, non-tender; bowel sounds normal; no masses,  no organomegaly   Vulva:  normal  Vagina: normal vagina  Cervix:  multiparous appearance and no cervical motion tenderness  Corpus: normal size, contour, position, consistency, mobility, non-tender  Adnexa:  normal adnexa  Rectal Exam: Not performed.        Assessment:   Normal postpartum exam. Pap smear not done at today's visit.   Plan:   1. Contraception: OCP (estrogen/progesterone) 2.. Follow up after 04/04/13 for annual exam and pap or earlier as needed.

## 2012-12-25 NOTE — Progress Notes (Signed)
Patient ID: Holly Powell, female   DOB: [birth year], 37 y.o.   MRN: [id number] Here today for post partum check.

## 2012-12-25 NOTE — Patient Instructions (Signed)
Return to clinic for any scheduled appointments or for any gynecologic concerns as needed.   

## 2013-01-17 ENCOUNTER — Telehealth: Payer: Self-pay | Admitting: *Deleted

## 2013-01-17 DIAGNOSIS — Z309 Encounter for contraceptive management, unspecified: Secondary | ICD-10-CM

## 2013-01-17 MED ORDER — LEVONORGESTREL-ETHINYL ESTRAD 0.1-20 MG-MCG PO TABS: 1.0000 | ORAL_TABLET | Freq: Every day | ORAL | Status: DC

## 2013-01-17 NOTE — Telephone Encounter (Signed)
Patient was told by [facility] that her medication has not been received so I resent the medication to her mail order pharmacy.

## 2013-03-18 ENCOUNTER — Telehealth: Payer: Self-pay | Admitting: *Deleted

## 2013-03-18 DIAGNOSIS — Z309 Encounter for contraceptive management, unspecified: Secondary | ICD-10-CM

## 2013-03-18 MED ORDER — LEVONORGESTREL-ETHINYL ESTRAD 0.1-20 MG-MCG PO TABS: 1.0000 | ORAL_TABLET | Freq: Every day | ORAL | Status: DC

## 2013-03-18 NOTE — Telephone Encounter (Signed)
Patient has misplaced a mail order pack of her birth control and needs [place] to call into her pharmacy so she can get a pack to keep her on schedule.

## 2013-06-13 ENCOUNTER — Other Ambulatory Visit: Payer: Self-pay

## 2013-09-30 ENCOUNTER — Other Ambulatory Visit (INDEPENDENT_AMBULATORY_CARE_PROVIDER_SITE_OTHER): Payer: 59 | Admitting: *Deleted

## 2013-09-30 ENCOUNTER — Ambulatory Visit: Payer: Self-pay | Admitting: Family Medicine

## 2013-09-30 DIAGNOSIS — Z01812 Encounter for preprocedural laboratory examination: Secondary | ICD-10-CM

## 2013-09-30 LAB — POCT URINE PREGNANCY: Preg Test, Ur: NEGATIVE

## 2013-09-30 NOTE — Progress Notes (Signed)
Patient needs to have an x ray and they will not preform test until she shows a negative pregnancy test.  Urine pregnancy test is negative today in the office.

## 2013-10-21 ENCOUNTER — Ambulatory Visit: Payer: Self-pay | Admitting: Family Medicine

## 2014-02-04 ENCOUNTER — Other Ambulatory Visit: Payer: Self-pay | Admitting: Obstetrics & Gynecology

## 2014-06-09 ENCOUNTER — Encounter: Payer: Self-pay | Admitting: Obstetrics & Gynecology

## 2016-03-18 ENCOUNTER — Ambulatory Visit (INDEPENDENT_AMBULATORY_CARE_PROVIDER_SITE_OTHER): Payer: 59 | Admitting: Family Medicine

## 2016-03-18 VITALS — BP 106/72 | HR 84 | Temp 97.8°F | Wt 162.0 lb

## 2016-03-18 DIAGNOSIS — R110 Nausea: Secondary | ICD-10-CM

## 2016-03-18 DIAGNOSIS — L237 Allergic contact dermatitis due to plants, except food: Secondary | ICD-10-CM

## 2016-03-18 LAB — PREGNANCY, URINE: Preg Test, Ur: NEGATIVE

## 2016-03-18 MED ORDER — CLOBETASOL PROPIONATE 0.05 % EX OINT: 1.0000 "application " | TOPICAL_OINTMENT | Freq: Two times a day (BID) | CUTANEOUS | 0 refills | Status: DC

## 2016-03-18 MED ORDER — PREDNISONE 20 MG PO TABS: 20.0000 mg | ORAL_TABLET | Freq: Every day | ORAL | 0 refills | Status: DC

## 2016-03-18 NOTE — Patient Instructions (Signed)
Follow up as needed

## 2016-03-18 NOTE — Progress Notes (Signed)
BP 106/72   Pulse 84   Temp 97.8 F (36.6 C)   Wt 162 lb (73.5 kg)   LMP 02/19/2016 (Approximate)   SpO2 100%   BMI 28.70 kg/m    Subjective:    Patient ID: Holly Powell, female    DOB: [birth year], 40 y.o.   MRN: [id number]  HPI: Holly Powell is a 40 y.o. female  Chief Complaint  Patient presents with  . Poison Ivy    started last Wed. all over facem arms, sides, legs. Started drying up but last night it started back.   Patient presents with 1 week history of poison ivy rash that is over the majority of her body. She has used many OTC products and was starting to see improvement, but then since last night the rash has come back and flared. Extremely itchy and bothersome to pt. She also c/o nausea the past week that is not normal for her. States if she goes a few hours without eating she is very nauseated, and in the mornings she feels it sometimes too. No vomiting or abdominal pain. States she doesn't think she is pregnant but is not on birth control so is unsure. LMP was about a month ago per patient.   Relevant past medical, surgical, family and social history reviewed and updated as indicated. Interim medical history since our last visit reviewed. Allergies and medications reviewed and updated.  Review of Systems  Constitutional: Negative.   Respiratory: Negative.   Cardiovascular: Negative.   Gastrointestinal: Positive for nausea.  Genitourinary: Negative.   Musculoskeletal: Negative.   Skin: Positive for rash.  Neurological: Negative.   Psychiatric/Behavioral: Negative.     Per HPI unless specifically indicated above     Objective:    BP 106/72   Pulse 84   Temp 97.8 F (36.6 C)   Wt 162 lb (73.5 kg)   LMP 02/19/2016 (Approximate)   SpO2 100%   BMI 28.70 kg/m   Wt Readings from Last 3 Encounters:  03/18/16 162 lb (73.5 kg)  12/25/12 166 lb (75.3 kg)  11/07/12 182 lb 6 oz (82.7 kg)    Physical Exam  Constitutional: She is oriented to person, place,  and time. She appears well-developed and well-nourished.  HENT:  Head: Atraumatic.  Eyes: Conjunctivae are normal. No scleral icterus.  Neck: Normal range of motion. Neck supple.  Cardiovascular: Normal rate and normal heart sounds.   Pulmonary/Chest: Effort normal.  Musculoskeletal: Normal range of motion.  Neurological: She is alert and oriented to person, place, and time.  Skin: Skin is warm. Rash (Contact dermatitis rash in large patches across multipe places, multiple stages of drying out) noted.  Psychiatric: She has a normal mood and affect. Her behavior is normal.  Nursing note and vitals reviewed.   Results for orders placed or performed in visit on 09/30/13  POCT urine pregnancy  Result Value Ref Range   Preg Test, Ur Negative       Assessment & Plan:   Problem List Items Addressed This Visit    None    Visit Diagnoses    Poison ivy    -  Primary   Slow prednisone taper and clobetasol ointment sent. Side effects discussed, patient agreeable to plan.    Nausea       Neg UPT, likely stress reaction from the widespread dermatitis currently occuring. Will evaluate further if no improvement with treatment.    Relevant Orders   Pregnancy, urine  Follow up plan: Return if symptoms worsen or fail to improve.

## 2016-05-20 ENCOUNTER — Encounter: Payer: 59 | Admitting: Unknown Physician Specialty

## 2016-06-17 ENCOUNTER — Encounter: Payer: Self-pay | Admitting: Unknown Physician Specialty

## 2016-08-03 ENCOUNTER — Encounter: Payer: 59 | Admitting: Unknown Physician Specialty

## 2016-09-19 ENCOUNTER — Encounter: Payer: Self-pay | Admitting: Unknown Physician Specialty

## 2016-09-19 ENCOUNTER — Ambulatory Visit (INDEPENDENT_AMBULATORY_CARE_PROVIDER_SITE_OTHER): Payer: 59 | Admitting: Unknown Physician Specialty

## 2016-09-19 VITALS — BP 120/77 | HR 94 | Temp 97.7°F | Ht 64.8 in | Wt 165.4 lb

## 2016-09-19 DIAGNOSIS — Z0000 Encounter for general adult medical examination without abnormal findings: Secondary | ICD-10-CM | POA: Diagnosis not present

## 2016-09-19 NOTE — Patient Instructions (Signed)
Think you're too busy to work out? We have the workout for you. In minutes, high-intensity interval training (H.I.I.T.) will have you sweating, breathing hard and maximizing the health benefits of exercise without the time commitment. Best of all, it's scientifically proven to work.  What Is H.I.I.T.? SHORT WORKOUTS 101 High-intensity interval training - referred to as H.I.I.T. - is based on the idea that short bursts of strenuous exercise can have a big impact on the body. If moderate exercise - like a 20-minute jog - is good for your heart, lungs and metabolism, H.I.I.T. packs the benefits of that workout and more into a few minutes. It may sound too good to be true, but learning this exercise technique and adapting it to your life can mean saving hours at the gym. If you think you don't have time to exercise, H.I.I.T. may be the workout for you.  You can try it with any aerobic activity you like. The principles of H.I.I.T. can be applied to running, biking, stair climbing, swimming, jumping rope, rowing, even hopping or skipping. (Yes, skipping!)  The downside? Even though H.I.I.T. lasts only minutes, the workouts are tough, requiring you to push your body near its limit.  HOW INTENSE IS HIGH INTENSITY? High-intensity exercise is obviously not a casual stroll down the street, but it's not a run-till-your-lungs-pop explosion, either. Think breathless, not winded. Heart-pounding, not exploding. Legs pumping, but not uncontrolled.  You don't need any fancy heart rate monitors to do these workouts. Use cues from your body as a guide. In the middle of a high-intensity workout you should be able to say single words, but not complete whole sentences. So, if you can keep chatting to your workout partner during this workout, pump it up a few notches.  05-27-29 Training This simple program will help you make the most of a short workout by improving heart health and endurance. Try it with your favorite  cardiovascular activity. The essentials of 05-27-29 training are simple. Run, ride or perhaps row on a rowing machine gently for 30 seconds, accelerate to a moderate pace for 20 seconds, then sprint as hard as you can for 10 seconds. (It should be called 30-20-10 training, obviously, but that is not as catchy.) Repeat.  You don't even need a stopwatch to monitor the 30-, 20-, and 10-second time changes. You can just count to yourself, which seems to make the intervals pass more quickly.  Best of all? The grueling, all-out portion of the workout lasts for only 10 seconds. C'mon, you can do anything for 10 seconds, right?  Got 10 Minutes? A solitary minute of hard work buried in 10 minutes of activity can make a big difference.  The 10-Minute Workout If you like to run, bike, row or swim - just a little bit - this workout is a great option for you. Step 1 Warm up for 2 minutes Step 2 Pedal, run or swim all-out for 20 seconds. Repeat 2 more times Warm down for 3 minutes    GET STARTED To benefit the most from really, really short workouts, you need to build the habit of doing them into your hectic life. Ideally, you'll complete the workout three times a week. The best way to build that habit is to start small and be willing to tweak your schedule where you can to accommodate your new workout.  First set up a spot in your house for your workout, equipped with whatever you need to get the job done: sneakers, a  chair, a towel, etc. Then slot your workout in before you would normally shower. (You can even do it in the bathroom.) Or wake up five minutes earlier and do it first thing in the morning, so you can head off to work feeling accomplished. Or do it during your lunch hour. Run up your office's stairs or grab a private conference room for just a few minutes. Or work it into your commute. If you walk or bike to work, add some heavy intervals on the way home.  GET A BOOST FROM MUSIC Creating a  workout playlist of high-energy tunes you love will not make your workout feel easier, but it may cause you to exercise harder without even realizing it. Best of all, if you are doing a really short workout, you need only one or two great tunes to get you through. If you are willing to try something a bit different, make your own music as you exercise. Sing, hum, clap your hands, whatever you can do to jam along to your playlist. It may give you an extra boost to finish strong.  Find a song or podcast that's the length of your really, really short workout. By the time the song is over, you're done.  Excerpted from the [place] Well column http://www.nytimes.com/well/guides/really-really-short-workouts?smid=fb-nytwell&smtyp=pay  Apps 7 minute women's [place] times.

## 2016-09-19 NOTE — Progress Notes (Signed)
   BP 120/77 (BP Location: Left Arm, Patient Position: Sitting, Cuff Size: Large)   Pulse 94   Temp 97.7 F (36.5 C)   Ht 5' 4.8" (1.646 m)   Wt 165 lb 6.4 oz (75 kg)   LMP 09/12/2016 (Exact Date)   SpO2 98%   BMI 27.69 kg/m    Subjective:    Patient ID: Holly Powell, female    DOB: 1976-06-26, 41 y.o.   MRN: [id number]  HPI: Holly Powell is a 41 y.o. female  Chief Complaint  Patient presents with  . Annual Exam   Weight gain Pt states she has had slow and continuous weight gain which she is unhappy with.   Relevant past medical, surgical, family and social history reviewed and updated as indicated. Interim medical history since our last visit reviewed. Allergies and medications reviewed and updated.  Review of Systems  Constitutional: Negative.   HENT: Negative.   Eyes: Negative.   Respiratory: Negative.   Cardiovascular: Negative.   Gastrointestinal: Negative.   Endocrine: Negative.   Genitourinary: Negative.   Musculoskeletal: Negative.   Skin: Negative.   Allergic/Immunologic: Negative.   Neurological: Negative.   Hematological: Negative.   Psychiatric/Behavioral: Negative.    Per HPI unless specifically indicated above    Objective:    BP 120/77 (BP Location: Left Arm, Patient Position: Sitting, Cuff Size: Large)   Pulse 94   Temp 97.7 F (36.5 C)   Ht 5' 4.8" (1.646 m)   Wt 165 lb 6.4 oz (75 kg)   LMP 09/12/2016 (Exact Date)   SpO2 98%   BMI 27.69 kg/m   Wt Readings from Last 3 Encounters:  09/19/16 165 lb 6.4 oz (75 kg)  03/18/16 162 lb (73.5 kg)  12/25/12 166 lb (75.3 kg)    Physical Exam  Constitutional: She is oriented to person, place, and time. She appears well-developed and well-nourished.  HENT:  Head: Normocephalic and atraumatic.  Eyes: Pupils are equal, round, and reactive to light. Right eye exhibits no discharge. Left eye exhibits no discharge. No scleral icterus.  Neck: Normal range of motion. Neck supple. Carotid bruit is not  present. No thyromegaly present.  Cardiovascular: Normal rate, regular rhythm and normal heart sounds.  Exam reveals no gallop and no friction rub.   No murmur heard. Pulmonary/Chest: Effort normal and breath sounds normal. No respiratory distress. She has no wheezes. She has no rales.  Abdominal: Soft. Bowel sounds are normal. There is no tenderness. There is no rebound.  Genitourinary: No breast swelling, tenderness or discharge.  Musculoskeletal: Normal range of motion.  Lymphadenopathy:    She has no cervical adenopathy.  Neurological: She is alert and oriented to person, place, and time.  Skin: Skin is warm, dry and intact. No rash noted.  Psychiatric: She has a normal mood and affect. Her speech is normal and behavior is normal. Judgment and thought content normal. Cognition and memory are normal.       Assessment & Plan:   Problem List Items Addressed This Visit    None    Visit Diagnoses    Annual physical exam    -  Primary   Relevant Orders   Comprehensive metabolic panel   Lipid Panel w/o Chol/HDL Ratio   TSH   CBC with Differential/Platelet       Follow up plan: Return in about 1 year (around 09/19/2017).

## 2016-09-20 LAB — COMPREHENSIVE METABOLIC PANEL
A/G RATIO: 1.9 (ref 1.2–2.2)
ALBUMIN: 4.5 g/dL (ref 3.5–5.5)
ALK PHOS: 90 IU/L (ref 39–117)
ALT: 15 IU/L (ref 0–32)
AST: 22 IU/L (ref 0–40)
BILIRUBIN TOTAL: 0.5 mg/dL (ref 0.0–1.2)
BUN/Creatinine Ratio: 20 (ref 9–23)
BUN: 16 mg/dL (ref 6–24)
CO2: 25 mmol/L (ref 18–29)
Calcium: 9.5 mg/dL (ref 8.7–10.2)
Chloride: 100 mmol/L (ref 96–106)
Creatinine, Ser: 0.82 mg/dL (ref 0.57–1.00)
GFR calc Af Amer: 102 mL/min/{1.73_m2} (ref >59)
GFR calc non Af Amer: 89 mL/min/{1.73_m2} (ref >59)
GLUCOSE: 90 mg/dL (ref 65–99)
Globulin, Total: 2.4 g/dL (ref 1.5–4.5)
Potassium: 4.1 mmol/L (ref 3.5–5.2)
Sodium: 139 mmol/L (ref 134–144)
Total Protein: 6.9 g/dL (ref 6.0–8.5)

## 2016-09-20 LAB — LIPID PANEL W/O CHOL/HDL RATIO
CHOLESTEROL TOTAL: 174 mg/dL (ref 100–199)
HDL: 56 mg/dL (ref >39)
LDL Calculated: 101 mg/dL — ABNORMAL HIGH (ref 0–99)
Triglycerides: 84 mg/dL (ref 0–149)
VLDL Cholesterol Cal: 17 mg/dL (ref 5–40)

## 2016-09-20 LAB — TSH: TSH: 1.1 u[IU]/mL (ref 0.450–4.500)

## 2016-09-20 LAB — CBC WITH DIFFERENTIAL/PLATELET
BASOS ABS: 0 10*3/uL (ref 0.0–0.2)
BASOS: 0 %
EOS (ABSOLUTE): 0.2 10*3/uL (ref 0.0–0.4)
Eos: 3 %
HEMATOCRIT: 36.3 % (ref 34.0–46.6)
HEMOGLOBIN: 12 g/dL (ref 11.1–15.9)
IMMATURE GRANS (ABS): 0 10*3/uL (ref 0.0–0.1)
Immature Granulocytes: 0 %
LYMPHS ABS: 2.4 10*3/uL (ref 0.7–3.1)
Lymphs: 39 %
MCH: 29.9 pg (ref 26.6–33.0)
MCHC: 33.1 g/dL (ref 31.5–35.7)
MCV: 91 fL (ref 79–97)
MONOCYTES: 6 %
Monocytes Absolute: 0.4 10*3/uL (ref 0.1–0.9)
NEUTROS ABS: 3.2 10*3/uL (ref 1.4–7.0)
Neutrophils: 52 %
PLATELETS: 293 10*3/uL (ref 150–379)
RBC: 4.01 x10E6/uL (ref 3.77–5.28)
RDW: 13.6 % (ref 12.3–15.4)
WBC: 6.2 10*3/uL (ref 3.4–10.8)

## 2016-09-20 NOTE — Progress Notes (Signed)
Normal labs.  Pt notified through [employer]

## 2016-10-05 ENCOUNTER — Ambulatory Visit (INDEPENDENT_AMBULATORY_CARE_PROVIDER_SITE_OTHER): Payer: 59 | Admitting: Unknown Physician Specialty

## 2016-10-05 ENCOUNTER — Encounter: Payer: Self-pay | Admitting: Unknown Physician Specialty

## 2016-10-05 VITALS — BP 109/75 | HR 81 | Temp 98.4°F | Wt 164.6 lb

## 2016-10-05 DIAGNOSIS — J101 Influenza due to other identified influenza virus with other respiratory manifestations: Secondary | ICD-10-CM | POA: Diagnosis not present

## 2016-10-05 DIAGNOSIS — H6502 Acute serous otitis media, left ear: Secondary | ICD-10-CM | POA: Diagnosis not present

## 2016-10-05 DIAGNOSIS — R509 Fever, unspecified: Secondary | ICD-10-CM | POA: Diagnosis not present

## 2016-10-05 LAB — VERITOR FLU A/B WAIVED
INFLUENZA A: NEGATIVE
INFLUENZA B: POSITIVE — AB

## 2016-10-05 MED ORDER — AZITHROMYCIN 250 MG PO TABS: ORAL_TABLET | ORAL | 0 refills | Status: DC

## 2016-10-05 NOTE — Progress Notes (Signed)
BP 109/75 (BP Location: Left Arm, Patient Position: Sitting, Cuff Size: Normal)   Pulse 81   Temp 98.4 F (36.9 C)   Wt 164 lb 9.6 oz (74.7 kg)   LMP 09/12/2016 (Exact Date)   SpO2 99%   BMI 27.56 kg/m    Subjective:    Patient ID: Holly Powell, female    DOB: 12-May-1976, 41 y.o.   MRN: [id number]  HPI: Holly Powell is a 41 y.o. female  Chief Complaint  Patient presents with  . URI    pt states she has had fever, chills, cough, congestions, left ear pain and headache since the weekend. States the symptoms started either Friday night or Saturday night.    URI Patient presents to clinic complaining of fever/chills, left ear pain, and intermittent headache x 5 days. She had a productive cough last week that has resolved. Denies chest pain, shortness of breath, sore throat, rhinorrhea, N/V/D. Denies sick contacts and did not have the flu shot this year. Took 3 Ibuprofen without relief.   Relevant past medical, surgical, family and social history reviewed and updated as indicated. Interim medical history since our last visit reviewed. Allergies and medications reviewed and updated.  Review of Systems  Constitutional: Positive for fever. Negative for chills and fatigue.  HENT: Positive for ear pain. Negative for congestion, ear discharge, rhinorrhea, sinus pain, sinus pressure, sore throat and trouble swallowing.   Eyes: Negative.   Respiratory: Negative for cough, chest tightness, shortness of breath, wheezing and stridor.   Cardiovascular: Negative for chest pain and palpitations.  Gastrointestinal: Negative for abdominal pain, constipation, diarrhea, nausea and vomiting.  Skin: Negative for rash.  Neurological: Positive for headaches. Negative for weakness.   Per HPI unless specifically indicated above    Objective:    BP 109/75 (BP Location: Left Arm, Patient Position: Sitting, Cuff Size: Normal)   Pulse 81   Temp 98.4 F (36.9 C)   Wt 164 lb 9.6 oz (74.7 kg)   LMP  09/12/2016 (Exact Date)   SpO2 99%   BMI 27.56 kg/m   Wt Readings from Last 3 Encounters:  10/05/16 164 lb 9.6 oz (74.7 kg)  09/19/16 165 lb 6.4 oz (75 kg)  03/18/16 162 lb (73.5 kg)    Physical Exam  Constitutional: She is oriented to person, place, and time. She appears well-developed and well-nourished. No distress.  HENT:  Head: Normocephalic and atraumatic.  Right Ear: Hearing, tympanic membrane, external ear and ear canal normal.  Left ear: erythematous ear canal. No drainage, swelling, tenderness. TM visualized. No effusion or bulging appreciated.   Eyes: Conjunctivae are normal. Right eye exhibits no discharge. Left eye exhibits no discharge. No scleral icterus.  Neck: Normal range of motion. Neck supple. No JVD present.  Cardiovascular: Normal rate, regular rhythm, normal heart sounds and intact distal pulses.   Pulmonary/Chest: Effort normal and breath sounds normal. No respiratory distress. She has no wheezes. She has no rales.  Abdominal: Soft. Bowel sounds are normal.  Musculoskeletal: Normal range of motion.  Neurological: She is alert and oriented to person, place, and time.  Skin: Skin is warm and dry.  Psychiatric: She has a normal mood and affect. Her behavior is normal. Judgment and thought content normal.       Assessment & Plan:   Problem List Items Addressed This Visit    None    Visit Diagnoses    Chills with fever    -  Primary   Relevant Orders  Veritor Flu A/B Waived   Influenza due to influenza virus, type B       Relevant Medications   azithromycin (ZITHROMAX Z-PAK) 250 MG tablet   Acute serous otitis media of left ear, recurrence not specified       Relevant Medications   azithromycin (ZITHROMAX Z-PAK) 250 MG tablet       Follow up plan: Return if symptoms worsen or fail to improve.

## 2017-01-03 ENCOUNTER — Encounter: Payer: Self-pay | Admitting: Emergency Medicine

## 2017-01-03 ENCOUNTER — Emergency Department
Admission: EM | Admit: 2017-01-03 | Discharge: 2017-01-03 | Disposition: A | Discharge status: Home / Self Care | Payer: 59 | Attending: Emergency Medicine | Admitting: Emergency Medicine

## 2017-01-03 DIAGNOSIS — R42 Dizziness and giddiness: Secondary | ICD-10-CM | POA: Insufficient documentation

## 2017-01-03 DIAGNOSIS — R55 Syncope and collapse: Secondary | ICD-10-CM | POA: Diagnosis present

## 2017-01-03 LAB — CBC
HCT: 35 % (ref 35.0–47.0)
Hemoglobin: 12 g/dL (ref 12.0–16.0)
MCH: 30.9 pg (ref 26.0–34.0)
MCHC: 34.4 g/dL (ref 32.0–36.0)
MCV: 89.8 fL (ref 80.0–100.0)
Platelets: 249 10*3/uL (ref 150–440)
RBC: 3.9 MIL/uL (ref 3.80–5.20)
RDW: 13.4 % (ref 11.5–14.5)
WBC: 6.3 10*3/uL (ref 3.6–11.0)

## 2017-01-03 LAB — URINALYSIS, COMPLETE (UACMP) WITH MICROSCOPIC
BILIRUBIN URINE: NEGATIVE
Glucose, UA: NEGATIVE mg/dL
Hgb urine dipstick: NEGATIVE
KETONES UR: NEGATIVE mg/dL
LEUKOCYTES UA: NEGATIVE
NITRITE: NEGATIVE
PH: 7 (ref 5.0–8.0)
PROTEIN: NEGATIVE mg/dL
Specific Gravity, Urine: 1.008 (ref 1.005–1.030)

## 2017-01-03 LAB — BASIC METABOLIC PANEL
Anion gap: 6 (ref 5–15)
BUN: 12 mg/dL (ref 6–20)
CHLORIDE: 108 mmol/L (ref 101–111)
CO2: 24 mmol/L (ref 22–32)
Calcium: 8.8 mg/dL — ABNORMAL LOW (ref 8.9–10.3)
Creatinine, Ser: 0.69 mg/dL (ref 0.44–1.00)
Glucose, Bld: 144 mg/dL — ABNORMAL HIGH (ref 65–99)
POTASSIUM: 3.9 mmol/L (ref 3.5–5.1)
SODIUM: 138 mmol/L (ref 135–145)

## 2017-01-03 LAB — POCT PREGNANCY, URINE: Preg Test, Ur: NEGATIVE
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
## 2017-01-03 NOTE — ED Notes (Signed)
UA Preg result = NEG

## 2017-01-04 ENCOUNTER — Ambulatory Visit (INDEPENDENT_AMBULATORY_CARE_PROVIDER_SITE_OTHER): Payer: 59 | Admitting: Family Medicine

## 2017-01-04 ENCOUNTER — Encounter: Payer: Self-pay | Admitting: Family Medicine

## 2017-01-04 VITALS — BP 112/76 | HR 65 | Temp 98.3°F | Wt 166.0 lb

## 2017-01-04 DIAGNOSIS — R55 Syncope and collapse: Secondary | ICD-10-CM

## 2017-01-04 MED ORDER — MECLIZINE HCL 25 MG PO TABS
25.0000 mg | ORAL_TABLET | Freq: Three times a day (TID) | ORAL | 0 refills | Status: DC | PRN
Start: 2017-01-04 — End: 2017-01-11
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
## 2017-01-06 DIAGNOSIS — R55 Syncope and collapse: Secondary | ICD-10-CM | POA: Insufficient documentation

## 2017-01-11 ENCOUNTER — Ambulatory Visit (INDEPENDENT_AMBULATORY_CARE_PROVIDER_SITE_OTHER): Payer: 59 | Admitting: Family Medicine

## 2017-01-11 ENCOUNTER — Encounter: Payer: Self-pay | Admitting: Family Medicine

## 2017-01-11 ENCOUNTER — Ambulatory Visit: Payer: 59 | Admitting: Family Medicine

## 2017-01-11 VITALS — BP 108/74 | HR 79 | Temp 97.6°F | Ht 63.5 in | Wt 165.2 lb

## 2017-01-11 DIAGNOSIS — Q383 Other congenital malformations of tongue: Secondary | ICD-10-CM

## 2017-01-11 DIAGNOSIS — F419 Anxiety disorder, unspecified: Secondary | ICD-10-CM | POA: Diagnosis not present

## 2017-01-11 MED ORDER — PREDNISONE 20 MG PO TABS: 40.0000 mg | ORAL_TABLET | Freq: Every day | ORAL | 0 refills | Status: DC

## 2017-01-11 MED ORDER — BUSPIRONE HCL 7.5 MG PO TABS: 7.5000 mg | ORAL_TABLET | Freq: Two times a day (BID) | ORAL | 1 refills | Status: DC

## 2017-01-11 NOTE — Progress Notes (Signed)
BP 108/74 (BP Location: Left Arm, Patient Position: Sitting, Cuff Size: Normal)   Pulse 79   Temp 97.6 F (36.4 C)   Ht 5' 3.5" (1.613 m)   Wt 165 lb 3.2 oz (74.9 kg)   LMP 12/13/2016 (Approximate)   BMI 28.80 kg/m    Subjective:    Patient ID: Holly Powell, female    DOB: 27-Aug-1975, 41 y.o.   MRN: [id number]  HPI: Holly Powell is a 41 y.o. female  Chief Complaint  Patient presents with  . Follow-up  . Anxiety    F/U for anxiety. Patient filling out GAD7.  Marland Kitchen Swollen Tongue    Patient states her tongue keeps swelling. States it's been going on for several days.   Patient presents today to f/u on significantly worsening anxiety since episode late last week of near syncope while driving with her son in the car. Is undergoing workup with cardiology currently to r/o cardiac cause. Has not had another dizzy spell since, but now finds that she is unable to drive independently without having full panic episodes. Finds herself severely anxious all day the past few days and it's impacting her quality of life. Still believes there is something physically wrong causing all of this but unsure of what. Does have a hx of OCD.   Also notes tongue swelling the past day or so. Finds that she is having panic episodes from this sensation as well. Trying benadryl for it which is only causing dry mouth sxs additionally. Denies tongue pain, sores, or other new medications. Has been taking extra magnesium and B12 lately but otherwise no changes.   GAD 7 : Generalized Anxiety Score 01/11/2017  Nervous, Anxious, on Edge 3  Control/stop worrying 3  Worry too much - different things 3  Trouble relaxing 3  Restless 3  Easily annoyed or irritable 0  Afraid - awful might happen 3  Total GAD 7 Score 18  Anxiety Difficulty Extremely difficult   Past Medical History:  Diagnosis Date  . GERD (gastroesophageal reflux disease)   . Pyelonephritis 2002   history of in2002 pregnancy   Social History    Social History  . Marital status: Married    Spouse name: N/A  . Number of children: N/A  . Years of education: N/A   Occupational History  . Not on file.   Social History Main Topics  . Smoking status: Never Smoker  . Smokeless tobacco: Never Used  . Alcohol use No  . Drug use: No  . Sexual activity: Yes    Birth control/ protection: None   Other Topics Concern  . Not on file   Social History Narrative  . No narrative on file    Relevant past medical, surgical, family and social history reviewed and updated as indicated. Interim medical history since our last visit reviewed. Allergies and medications reviewed and updated.  Review of Systems  Constitutional: Negative.   HENT:       Tongue swelling  Respiratory: Negative.   Cardiovascular: Negative.   Gastrointestinal: Negative.   Genitourinary: Negative.   Musculoskeletal: Negative.   Neurological: Negative.   Psychiatric/Behavioral: The patient is nervous/anxious.     Per HPI unless specifically indicated above     Objective:    BP 108/74 (BP Location: Left Arm, Patient Position: Sitting, Cuff Size: Normal)   Pulse 79   Temp 97.6 F (36.4 C)   Ht 5' 3.5" (1.613 m)   Wt 165 lb 3.2 oz (74.9 kg)  LMP 12/13/2016 (Approximate)   BMI 28.80 kg/m   Wt Readings from Last 3 Encounters:  01/11/17 165 lb 3.2 oz (74.9 kg)  01/04/17 166 lb (75.3 kg)  01/03/17 165 lb (74.8 kg)    Physical Exam  Constitutional: She is oriented to person, place, and time. She appears well-developed and well-nourished. No distress.  HENT:  Head: Atraumatic.  Tongue appears benign, no evidence of lesions, thrush, or obstruction of airway. Mobility intact.   Eyes: Conjunctivae are normal. Pupils are equal, round, and reactive to light.  Neck: Normal range of motion. Neck supple.  Cardiovascular: Normal rate and normal heart sounds.   Pulmonary/Chest: Effort normal and breath sounds normal. No respiratory distress.   Musculoskeletal: Normal range of motion.  Neurological: She is alert and oriented to person, place, and time.  Skin: Skin is warm and dry.  Psychiatric:  Tearful, very anxious demeanor  Nursing note and vitals reviewed.  Results for orders placed or performed during the hospital encounter of 51/02/58  Basic metabolic panel  Result Value Ref Range   Sodium 138 135 - 145 mmol/L   Potassium 3.9 3.5 - 5.1 mmol/L   Chloride 108 101 - 111 mmol/L   CO2 24 22 - 32 mmol/L   Glucose, Bld 144 (H) 65 - 99 mg/dL   BUN 12 6 - 20 mg/dL   Creatinine, Ser 0.69 0.44 - 1.00 mg/dL   Calcium 8.8 (L) 8.9 - 10.3 mg/dL   GFR calc non Af Amer >60 >60 mL/min   GFR calc Af Amer >60 >60 mL/min   Anion gap 6 5 - 15  CBC  Result Value Ref Range   WBC 6.3 3.6 - 11.0 K/uL   RBC 3.90 3.80 - 5.20 MIL/uL   Hemoglobin 12.0 12.0 - 16.0 g/dL   HCT 35.0 35.0 - 47.0 %   MCV 89.8 80.0 - 100.0 fL   MCH 30.9 26.0 - 34.0 pg   MCHC 34.4 32.0 - 36.0 g/dL   RDW 13.4 11.5 - 14.5 %   Platelets 249 150 - 440 K/uL  Urinalysis, Complete w Microscopic  Result Value Ref Range   Color, Urine YELLOW (A) YELLOW   APPearance CLEAR (A) CLEAR   Specific Gravity, Urine 1.008 1.005 - 1.030   pH 7.0 5.0 - 8.0   Glucose, UA NEGATIVE NEGATIVE mg/dL   Hgb urine dipstick NEGATIVE NEGATIVE   Bilirubin Urine NEGATIVE NEGATIVE   Ketones, ur NEGATIVE NEGATIVE mg/dL   Protein, ur NEGATIVE NEGATIVE mg/dL   Nitrite NEGATIVE NEGATIVE   Leukocytes, UA NEGATIVE NEGATIVE   RBC / HPF 0-5 0 - 5 RBC/hpf   WBC, UA 0-5 0 - 5 WBC/hpf   Bacteria, UA RARE (A) NONE SEEN   Squamous Epithelial / LPF 0-5 (A) NONE SEEN  Pregnancy, urine POC  Result Value Ref Range   Preg Test, Ur NEGATIVE NEGATIVE      Assessment & Plan:   Problem List Items Addressed This Visit    None    Visit Diagnoses    Anxiety    -  Primary   Discussed importance of counseling to help cope with her new fear of driving since episode. List of providers given. Will start  buspar additionally.   Relevant Medications   busPIRone (BUSPAR) 7.5 MG tablet   Tongue abnormality       Suspect mostly anxiety-related sensation. Exam benign. Prednisone burst sent in case worsening, but discussed to go to ER if obstructing airway at any point  Risks and benefits of buspar discussed at length.   Await cardiac testing results later this week.    Follow up plan: Return in about 4 weeks (around 02/08/2017) for anxiety.

## 2017-01-13 DIAGNOSIS — R531 Weakness: Secondary | ICD-10-CM | POA: Insufficient documentation

## 2017-01-13 DIAGNOSIS — R5383 Other fatigue: Secondary | ICD-10-CM | POA: Insufficient documentation

## 2017-01-16 ENCOUNTER — Ambulatory Visit (INDEPENDENT_AMBULATORY_CARE_PROVIDER_SITE_OTHER): Payer: 59 | Admitting: Unknown Physician Specialty

## 2017-01-16 ENCOUNTER — Ambulatory Visit: Payer: 59 | Admitting: Unknown Physician Specialty

## 2017-01-16 ENCOUNTER — Encounter: Payer: Self-pay | Admitting: Unknown Physician Specialty

## 2017-01-16 VITALS — BP 123/84 | HR 91 | Temp 97.5°F | Wt 160.2 lb

## 2017-01-16 DIAGNOSIS — F419 Anxiety disorder, unspecified: Secondary | ICD-10-CM

## 2017-01-16 DIAGNOSIS — G4700 Insomnia, unspecified: Secondary | ICD-10-CM

## 2017-01-16 DIAGNOSIS — R002 Palpitations: Secondary | ICD-10-CM | POA: Diagnosis not present

## 2017-01-16 DIAGNOSIS — R5383 Other fatigue: Secondary | ICD-10-CM

## 2017-01-16 DIAGNOSIS — R29898 Other symptoms and signs involving the musculoskeletal system: Secondary | ICD-10-CM

## 2017-01-16 LAB — BAYER DCA HB A1C WAIVED: HB A1C (BAYER DCA - WAIVED): 4.9 % (ref <7.0)

## 2017-01-16 LAB — GLUCOSE HEMOCUE WAIVED: GLU HEMOCUE WAIVED: 101 mg/dL — AB (ref 65–99)

## 2017-01-16 MED ORDER — CITALOPRAM HYDROBROMIDE 20 MG PO TABS: 20.0000 mg | ORAL_TABLET | Freq: Every day | ORAL | 3 refills | Status: DC

## 2017-01-16 MED ORDER — LORAZEPAM 0.5 MG PO TABS: 0.5000 mg | ORAL_TABLET | Freq: Every evening | ORAL | 1 refills | Status: DC | PRN

## 2017-01-16 NOTE — Patient Instructions (Addendum)
Magnesium in the evening  Melatonin right before lights - lower doses Vitamin D in the AM

## 2017-01-16 NOTE — Progress Notes (Signed)
BP 123/84   Pulse 91   Temp 97.5 F (36.4 C)   Wt 160 lb 3.2 oz (72.7 kg)   LMP 01/06/2017 (Exact Date)   SpO2 100%   BMI 27.93 kg/m    Subjective:    Patient ID: Holly Powell, female    DOB: [birth year], 41 y.o.   MRN: [id number]  HPI: Holly Powell is a 41 y.o. female  Chief Complaint  Patient presents with  . Anxiety    pt states she had a panic attack the night before last, states the right side of her face is numb now and feels like she is having another attack   Near syncope - pt went to the ER 5/29 due to near syncope when driving.  Reviewed ER note and no significant findings found.  Saw Merrie Roof and given Buspar for anxiety which did not seem to help.  She feels like she is getting worse.    Legs feel heavy. Constant anxiety.  Insomnia.  Palpitations. Loss of appetite. Hair is falling out.  Hands get numb and sometimes face is numb.  She describes panic attacks in which she feels she can't breath.  Husband states it happens suddenly and lasts 15-20 minutes.    She is using essential oils in a diffuser, meditation.  Took Benadryl the last 2 nights.  Taking 10 mg Melatonin.     She felt perfectly fine   Depression screen [facility] 2/9 09/19/2016  Decreased Interest 0  Down, Depressed, Hopeless 0  PHQ - 2 Score 0   Relevant past medical, surgical, family and social history reviewed and updated as indicated. Interim medical history since our last visit reviewed. Allergies and medications reviewed and updated.  Review of Systems  Per HPI unless specifically indicated above     Objective:    BP 123/84   Pulse 91   Temp 97.5 F (36.4 C)   Wt 160 lb 3.2 oz (72.7 kg)   LMP 01/06/2017 (Exact Date)   SpO2 100%   BMI 27.93 kg/m   Wt Readings from Last 3 Encounters:  01/16/17 160 lb 3.2 oz (72.7 kg)  01/11/17 165 lb 3.2 oz (74.9 kg)  01/04/17 166 lb (75.3 kg)    Physical Exam  Constitutional: She is oriented to person, place, and time. She appears well-developed  and well-nourished. No distress.  HENT:  Head: Normocephalic and atraumatic.  Eyes: Conjunctivae and lids are normal. Right eye exhibits no discharge. Left eye exhibits no discharge. No scleral icterus.  Neck: Normal range of motion. Neck supple. No JVD present. Carotid bruit is not present.  Cardiovascular: Normal rate, regular rhythm and normal heart sounds.   Pulmonary/Chest: Effort normal and breath sounds normal.  Abdominal: Normal appearance. There is no splenomegaly or hepatomegaly.  Musculoskeletal: Normal range of motion.  Neurological: She is alert and oriented to person, place, and time.  Skin: Skin is warm, dry and intact. No rash noted. No pallor.  Psychiatric: She has a normal mood and affect. Her behavior is normal. Judgment and thought content normal.    Results for orders placed or performed during the hospital encounter of 36/64/40  Basic metabolic panel  Result Value Ref Range   Sodium 138 135 - 145 mmol/L   Potassium 3.9 3.5 - 5.1 mmol/L   Chloride 108 101 - 111 mmol/L   CO2 24 22 - 32 mmol/L   Glucose, Bld 144 (H) 65 - 99 mg/dL   BUN 12 6 - 20 mg/dL  Creatinine, Ser 0.69 0.44 - 1.00 mg/dL   Calcium 8.8 (L) 8.9 - 10.3 mg/dL   GFR calc non Af Amer >60 >60 mL/min   GFR calc Af Amer >60 >60 mL/min   Anion gap 6 5 - 15  CBC  Result Value Ref Range   WBC 6.3 3.6 - 11.0 K/uL   RBC 3.90 3.80 - 5.20 MIL/uL   Hemoglobin 12.0 12.0 - 16.0 g/dL   HCT 35.0 35.0 - 47.0 %   MCV 89.8 80.0 - 100.0 fL   MCH 30.9 26.0 - 34.0 pg   MCHC 34.4 32.0 - 36.0 g/dL   RDW 13.4 11.5 - 14.5 %   Platelets 249 150 - 440 K/uL  Urinalysis, Complete w Microscopic  Result Value Ref Range   Color, Urine YELLOW (A) YELLOW   APPearance CLEAR (A) CLEAR   Specific Gravity, Urine 1.008 1.005 - 1.030   pH 7.0 5.0 - 8.0   Glucose, UA NEGATIVE NEGATIVE mg/dL   Hgb urine dipstick NEGATIVE NEGATIVE   Bilirubin Urine NEGATIVE NEGATIVE   Ketones, ur NEGATIVE NEGATIVE mg/dL   Protein, ur NEGATIVE  NEGATIVE mg/dL   Nitrite NEGATIVE NEGATIVE   Leukocytes, UA NEGATIVE NEGATIVE   RBC / HPF 0-5 0 - 5 RBC/hpf   WBC, UA 0-5 0 - 5 WBC/hpf   Bacteria, UA RARE (A) NONE SEEN   Squamous Epithelial / LPF 0-5 (A) NONE SEEN  Pregnancy, urine POC  Result Value Ref Range   Preg Test, Ur NEGATIVE NEGATIVE      Assessment & Plan:   Pt here with sudden onset of panic, anxiety, fatigue, palpitations, insomnia. Check following labs.    Problem List Items Addressed This Visit    None    Visit Diagnoses    Fatigue, unspecified type    -  Primary   Relevant Orders   Vitamin B12   ANA w/Reflex   VITAMIN D 25 Hydroxy (Vit-D Deficiency, Fractures)   Glucose Hemocue Waived   Bayer DCA Hb A1c Waived   Cortisol   Insomnia, unspecified type       Relevant Orders   Cortisol   Heart palpitations       Relevant Medications   citalopram (CELEXA) 20 MG tablet   LORazepam (ATIVAN) 0.5 MG tablet   Other Relevant Orders   Thyroid Panel With TSH   Weakness of both lower extremities       Relevant Orders   Sed Rate (ESR)   Anxiety       Lorazepam to help sleep at night and Citalopram 20 mg daily   Relevant Medications   citalopram (CELEXA) 20 MG tablet   LORazepam (ATIVAN) 0.5 MG tablet      Cortisol level in the AM.     Follow up plan: Return in about 1 week (around 01/23/2017).

## 2017-01-17 ENCOUNTER — Encounter: Payer: 59 | Admitting: Obstetrics and Gynecology

## 2017-01-17 ENCOUNTER — Telehealth: Payer: Self-pay | Admitting: Unknown Physician Specialty

## 2017-01-17 LAB — VITAMIN B12: Vitamin B-12: 852 pg/mL (ref 232–1245)

## 2017-01-17 LAB — SEDIMENTATION RATE: SED RATE: 4 mm/h (ref 0–32)

## 2017-01-17 LAB — ANA W/REFLEX: ANA: NEGATIVE

## 2017-01-17 LAB — THYROID PANEL WITH TSH
Free Thyroxine Index: 2.5 (ref 1.2–4.9)
T3 UPTAKE RATIO: 29 % (ref 24–39)
T4, Total: 8.6 ug/dL (ref 4.5–12.0)
TSH: 1.71 u[IU]/mL (ref 0.450–4.500)

## 2017-01-17 LAB — VITAMIN D 25 HYDROXY (VIT D DEFICIENCY, FRACTURES): VIT D 25 HYDROXY: 29 ng/mL — AB (ref 30.0–100.0)

## 2017-01-17 LAB — CORTISOL: Cortisol: 11.3 ug/dL

## 2017-01-17 NOTE — Telephone Encounter (Signed)
Called and spoke to patient. I let her know what Malachy Mood said about labs. Patient stated that she did sleep for about 6 hours last night and has felt a little bit better today. She states that they plan to drive around their neighborhood to get her used to riding in the car again.

## 2017-01-17 NOTE — Telephone Encounter (Signed)
Called and left patient a VM asking for her to please return my call.  

## 2017-01-17 NOTE — Telephone Encounter (Signed)
Patient called in regards to not coming in for her cortisol labs this morning due to the fear of having another panic attack in her car. Patient would like to speak with provider to know when she should come in or if she should wait for upcoming appointment.  Please Advise.  Thank you

## 2017-01-17 NOTE — Telephone Encounter (Signed)
It is fine to wait for her next appt.  Please let her know all her labs were normal.  Also, ask if she was able to sleep last night

## 2017-01-17 NOTE — Telephone Encounter (Signed)
Routing to provider for advice.

## 2017-01-23 ENCOUNTER — Encounter: Payer: Self-pay | Admitting: Unknown Physician Specialty

## 2017-01-23 ENCOUNTER — Ambulatory Visit: Payer: 59 | Admitting: Unknown Physician Specialty

## 2017-01-23 ENCOUNTER — Telehealth: Payer: Self-pay | Admitting: Unknown Physician Specialty

## 2017-01-23 NOTE — Telephone Encounter (Signed)
Pt called and would like to have the following labs done: T3 and T4, reverse T3, thyroid antibodies, 4 iron labs, saliva adrenal cortisol, b12 and folate, cmp, vitamin d 3, sex hormones, 10 mthfr.

## 2017-01-23 NOTE — Telephone Encounter (Signed)
Patient has appointment 01/30/17. See mychart message from today, read by patient.

## 2017-01-23 NOTE — Telephone Encounter (Signed)
Routing to provider  

## 2017-01-23 NOTE — Telephone Encounter (Signed)
Routing to provider. It looks like the thyroid labs, B12, and vitamin D were done on 01/16/17. Can we order the others that the patient is requesting?

## 2017-01-23 NOTE — Telephone Encounter (Signed)
According to my notes, she is supporsed to see me today

## 2017-01-24 ENCOUNTER — Ambulatory Visit (INDEPENDENT_AMBULATORY_CARE_PROVIDER_SITE_OTHER): Payer: 59 | Admitting: Unknown Physician Specialty

## 2017-01-24 ENCOUNTER — Encounter: Payer: Self-pay | Admitting: Unknown Physician Specialty

## 2017-01-24 VITALS — BP 116/76 | HR 93 | Temp 98.6°F | Wt 163.0 lb

## 2017-01-24 DIAGNOSIS — R5383 Other fatigue: Secondary | ICD-10-CM | POA: Diagnosis not present

## 2017-01-24 DIAGNOSIS — E559 Vitamin D deficiency, unspecified: Secondary | ICD-10-CM

## 2017-01-24 DIAGNOSIS — L659 Nonscarring hair loss, unspecified: Secondary | ICD-10-CM

## 2017-01-24 MED ORDER — ESCITALOPRAM OXALATE 10 MG PO TABS: 10.0000 mg | ORAL_TABLET | Freq: Every day | ORAL | 2 refills | Status: DC

## 2017-01-24 NOTE — Progress Notes (Signed)
BP 116/76   Pulse 93   Temp 98.6 F (37 C)   Wt 163 lb (73.9 kg)   LMP 01/06/2017 (Exact Date)   SpO2 98%   BMI 28.42 kg/m    Subjective:    Patient ID: Holly Powell, female    DOB: 1976/03/05, 41 y.o.   MRN: [id number]  HPI: Holly Powell is a 42 y.o. female  Chief Complaint  Patient presents with  . Panic Attack    1 week f/up   Pt with extreme sleepiness and fatigue.  She continues with brain fog.  Hair thinning.  She has difficulty concentrating.  She claims legs are heavy.  Eating a paleo diet.  She does feel weak and dizzy upon standing.  She is taking Citalopram.  She has a number of additional labs she would like to have done.    Relevant past medical, surgical, family and social history reviewed and updated as indicated. Interim medical history since our last visit reviewed. Allergies and medications reviewed and updated.  Review of Systems  Per HPI unless specifically indicated above     Objective:    BP 116/76   Pulse 93   Temp 98.6 F (37 C)   Wt 163 lb (73.9 kg)   LMP 01/06/2017 (Exact Date)   SpO2 98%   BMI 28.42 kg/m   Wt Readings from Last 3 Encounters:  01/24/17 163 lb (73.9 kg)  01/16/17 160 lb 3.2 oz (72.7 kg)  01/11/17 165 lb 3.2 oz (74.9 kg)    Physical Exam  Constitutional: She is oriented to person, place, and time. She appears well-developed and well-nourished. No distress.  HENT:  Head: Normocephalic and atraumatic.  Eyes: Conjunctivae and lids are normal. Right eye exhibits no discharge. Left eye exhibits no discharge. No scleral icterus.  Neck: Normal range of motion. Neck supple. No JVD present. Carotid bruit is not present.  Cardiovascular: Normal rate, regular rhythm and normal heart sounds.   Pulmonary/Chest: Effort normal and breath sounds normal.  Abdominal: Normal appearance. There is no splenomegaly or hepatomegaly.  Musculoskeletal: Normal range of motion.  Neurological: She is alert and oriented to person, place, and  time.  Skin: Skin is warm, dry and intact. No rash noted. No pallor.  Psychiatric: She has a normal mood and affect. Her behavior is normal. Judgment and thought content normal.    Results for orders placed or performed in visit on 01/16/17  Thyroid Panel With TSH  Result Value Ref Range   TSH 1.710 0.450 - 4.500 uIU/mL   T4, Total 8.6 4.5 - 12.0 ug/dL   T3 Uptake Ratio 29 24 - 39 %   Free Thyroxine Index 2.5 1.2 - 4.9  Cortisol  Result Value Ref Range   Cortisol 11.3 ug/dL  Vitamin B12  Result Value Ref Range   Vitamin B-12 852 232 - 1,245 pg/mL  ANA w/Reflex  Result Value Ref Range   Anit Nuclear Antibody(ANA) Negative Negative  VITAMIN D 25 Hydroxy (Vit-D Deficiency, Fractures)  Result Value Ref Range   Vit D, 25-Hydroxy 29.0 (L) 30.0 - 100.0 ng/mL  Glucose Hemocue Waived  Result Value Ref Range   Glu Hemocue Waived 101 (H) 65 - 99 mg/dL  Bayer DCA Hb A1c Waived  Result Value Ref Range   Bayer DCA Hb A1c Waived 4.9 <7.0 %  Sed Rate (ESR)  Result Value Ref Range   Sed Rate 4 0 - 32 mm/hr      Assessment & Plan:  Problem List Items Addressed This Visit    None    Visit Diagnoses    Fatigue, unspecified type    -  Primary   Relevant Orders   T3, free   T3, reverse   Ferritin   Magnesium   Hair loss       Relevant Orders   Copper, Free   Zinc   DHEA-sulfate   Vitamin D deficiency       Relevant Orders   Vitamin D 1,25 dihydroxy       Follow up plan: Return in about 4 weeks (around 02/21/2017).

## 2017-01-25 ENCOUNTER — Encounter: Payer: Self-pay | Admitting: Unknown Physician Specialty

## 2017-01-25 NOTE — Progress Notes (Signed)
Normal labs.  Pt notified through [employer]

## 2017-01-27 LAB — T3, FREE: T3 FREE: 2.3 pg/mL (ref 2.0–4.4)

## 2017-01-27 LAB — ZINC

## 2017-01-27 LAB — MAGNESIUM: Magnesium: 2.3 mg/dL (ref 1.6–2.3)

## 2017-01-27 LAB — VITAMIN D 1,25 DIHYDROXY
VITAMIN D 1, 25 (OH) TOTAL: 31 pg/mL
VITAMIN D3 1, 25 (OH): 30 pg/mL

## 2017-01-27 LAB — DHEA-SULFATE: DHEA-SO4: 119.3 ug/dL (ref 57.3–279.2)

## 2017-01-27 LAB — COPPER, FREE

## 2017-01-27 LAB — T3, REVERSE: REVERSE T3, SERUM: 22.1 ng/dL (ref 9.2–24.1)

## 2017-01-27 LAB — FERRITIN: Ferritin: 42 ng/mL (ref 15–150)

## 2017-01-30 ENCOUNTER — Ambulatory Visit: Payer: 59 | Admitting: Unknown Physician Specialty

## 2017-02-27 ENCOUNTER — Ambulatory Visit: Payer: 59 | Admitting: Unknown Physician Specialty

## 2017-03-31 ENCOUNTER — Encounter: Payer: Self-pay | Admitting: Unknown Physician Specialty

## 2017-03-31 ENCOUNTER — Ambulatory Visit (INDEPENDENT_AMBULATORY_CARE_PROVIDER_SITE_OTHER): Payer: 59 | Admitting: Unknown Physician Specialty

## 2017-03-31 DIAGNOSIS — F419 Anxiety disorder, unspecified: Secondary | ICD-10-CM | POA: Diagnosis not present

## 2017-03-31 DIAGNOSIS — B351 Tinea unguium: Secondary | ICD-10-CM | POA: Diagnosis not present

## 2017-03-31 MED ORDER — ESCITALOPRAM OXALATE 10 MG PO TABS
10.0000 mg | ORAL_TABLET | Freq: Every day | ORAL | 1 refills | Status: DC
Start: 2017-03-31 — End: 2017-08-11

## 2017-03-31 MED ORDER — TERBINAFINE HCL 250 MG PO TABS: 250.0000 mg | ORAL_TABLET | Freq: Every day | ORAL | 0 refills | Status: DC

## 2017-03-31 NOTE — Progress Notes (Signed)
BP 110/75   Pulse 79   Temp 98.2 F (36.8 C)   Wt 148 lb 3.2 oz (67.2 kg)   LMP 03/09/2017 (Approximate)   SpO2 99%   BMI 25.84 kg/m    Subjective:    Patient ID: Holly Powell, female    DOB: 02-04-1976, 41 y.o.   MRN: [id number]  HPI: Holly Powell is a 41 y.o. female  Chief Complaint  Patient presents with  . Anxiety    pt states she would like to have her lexapro increased  . Nail Problem    pt states that she has had a place on her toenail for about a year that she would like looked at   . Migraine    pt states she wants to talk about "silent migraine"   Anxiety Pt states her anxiety is better.  Seeing a functional medical doctor.  Through the functional medicine doctor she stopped the Escitalopram.  She states about a week following getting her off she had another panic attack while driving.  Ever since then she had trouble.  Because of the symptoms that happen suddenly, she wonders if she is having a silent migraine because the symptoms start in the eyes.  The only change she made was stopping the Escitalopram.  She hasn't driven much since then.  She drove here but did not have an attack.  She tried counseling once.  She would like to restart the Escitalopram   Depression screen [facility] 2/9 03/31/2017 09/19/2016  Decreased Interest 0 0  Down, Depressed, Hopeless 0 0  PHQ - 2 Score 0 0  Altered sleeping 0 -  Tired, decreased energy 2 -  Change in appetite 0 -  Feeling bad or failure about yourself  0 -  Trouble concentrating 2 -  Moving slowly or fidgety/restless 2 -  Suicidal thoughts 0 -  PHQ-9 Score 6 -   Toe nail fungus Pt with right great toe and she is interested in treatment.    Relevant past medical, surgical, family and social history reviewed and updated as indicated. Interim medical history since our last visit reviewed. Allergies and medications reviewed and updated.  Review of Systems  Per HPI unless specifically indicated above     Objective:    BP 110/75   Pulse 79   Temp 98.2 F (36.8 C)   Wt 148 lb 3.2 oz (67.2 kg)   LMP 03/09/2017 (Approximate)   SpO2 99%   BMI 25.84 kg/m   Wt Readings from Last 3 Encounters:  03/31/17 148 lb 3.2 oz (67.2 kg)  01/24/17 163 lb (73.9 kg)  01/16/17 160 lb 3.2 oz (72.7 kg)    Physical Exam  Constitutional: She is oriented to person, place, and time. She appears well-developed and well-nourished. No distress.  HENT:  Head: Normocephalic and atraumatic.  Eyes: Conjunctivae and lids are normal. Right eye exhibits no discharge. Left eye exhibits no discharge. No scleral icterus.  Neck: Normal range of motion. Neck supple. No JVD present. Carotid bruit is not present.  Cardiovascular: Normal rate, regular rhythm and normal heart sounds.   Pulmonary/Chest: Effort normal and breath sounds normal.  Abdominal: Normal appearance. There is no splenomegaly or hepatomegaly.  Musculoskeletal: Normal range of motion.  Neurological: She is alert and oriented to person, place, and time.  Skin: Skin is warm, dry and intact. No rash noted. No pallor.  Psychiatric: She has a normal mood and affect. Her behavior is normal. Judgment and thought content normal.  Assessment & Plan:   Problem List Items Addressed This Visit      Unprioritized   Anxiety    Restart Lexapro to 10 mg.  Reminded to give it 4-5 weeks.        Relevant Medications   escitalopram (LEXAPRO) 10 MG tablet   Onychomycosis of right great toe    Start Lamisil 250 mg daily      Relevant Medications   terbinafine (LAMISIL) 250 MG tablet       Follow up plan: Return in about 4 weeks (around 04/28/2017).

## 2017-03-31 NOTE — Assessment & Plan Note (Signed)
Start Lamisil 250 mg daily

## 2017-03-31 NOTE — Assessment & Plan Note (Addendum)
Restart Lexapro to 10 mg.  Reminded to give it 4-5 weeks.

## 2017-05-05 ENCOUNTER — Ambulatory Visit (INDEPENDENT_AMBULATORY_CARE_PROVIDER_SITE_OTHER): Payer: 59 | Admitting: Unknown Physician Specialty

## 2017-05-05 ENCOUNTER — Encounter: Payer: Self-pay | Admitting: Unknown Physician Specialty

## 2017-05-05 DIAGNOSIS — F419 Anxiety disorder, unspecified: Secondary | ICD-10-CM

## 2017-05-05 NOTE — Progress Notes (Signed)
BP 94/62   Pulse 76   Temp 97.9 F (36.6 C)   Wt 147 lb 6.4 oz (66.9 kg)   LMP 04/08/2017 (Approximate)   SpO2 100%   BMI 25.70 kg/m    Subjective:    Patient ID: Holly Powell, female    DOB: [birth year], 41 y.o.   MRN: [id number]  HPI: Holly Powell is a 41 y.o. female  Chief Complaint  Patient presents with  . Anxiety    4 week f/up    Anxiety Restarted Escitatopram last visit due to continued panic attacks. House is the her comfort zone.  States she has anxiety still when she is driving and going to the store but no anxiety and panic attacks.  She is feeling focused and goal oriented.  She is also on keto and intermittent fasting.   Depression screen [facility] 2/9 05/05/2017 03/31/2017 09/19/2016  Decreased Interest 0 0 0  Down, Depressed, Hopeless 0 0 0  PHQ - 2 Score 0 0 0  Altered sleeping 1 0 -  Tired, decreased energy 1 2 -  Change in appetite 2 0 -  Feeling bad or failure about yourself  0 0 -  Trouble concentrating 2 2 -  Moving slowly or fidgety/restless 1 2 -  Suicidal thoughts 0 0 -  PHQ-9 Score 7 6 -    Relevant past medical, surgical, family and social history reviewed and updated as indicated. Interim medical history since our last visit reviewed. Allergies and medications reviewed and updated.  Review of Systems  Per HPI unless specifically indicated above     Objective:    BP 94/62   Pulse 76   Temp 97.9 F (36.6 C)   Wt 147 lb 6.4 oz (66.9 kg)   LMP 04/08/2017 (Approximate)   SpO2 100%   BMI 25.70 kg/m   Wt Readings from Last 3 Encounters:  05/05/17 147 lb 6.4 oz (66.9 kg)  03/31/17 148 lb 3.2 oz (67.2 kg)  01/24/17 163 lb (73.9 kg)    Physical Exam  Constitutional: She is oriented to person, place, and time. She appears well-developed and well-nourished. No distress.  HENT:  Head: Normocephalic and atraumatic.  Eyes: Conjunctivae and lids are normal. Right eye exhibits no discharge. Left eye exhibits no discharge. No scleral icterus.    Neck: Normal range of motion. Neck supple. No JVD present. Carotid bruit is not present.  Cardiovascular: Normal rate, regular rhythm and normal heart sounds.   Pulmonary/Chest: Effort normal and breath sounds normal.  Abdominal: Normal appearance. There is no splenomegaly or hepatomegaly.  Musculoskeletal: Normal range of motion.  Neurological: She is alert and oriented to person, place, and time.  Skin: Skin is warm, dry and intact. No rash noted. No pallor.  Psychiatric: She has a normal mood and affect. Her behavior is normal. Judgment and thought content normal.    Results for orders placed or performed in visit on 01/24/17  T3, free  Result Value Ref Range   T3, Free 2.3 2.0 - 4.4 pg/mL  T3, reverse  Result Value Ref Range   Reverse T3, Serum 22.1 9.2 - 24.1 ng/dL  Ferritin  Result Value Ref Range   Ferritin 42 15 - 150 ng/mL  Copper, Free  Result Value Ref Range   Copper, Free CANCELED mcg/L  Zinc  Result Value Ref Range   Zinc CANCELED ug/dL  DHEA-sulfate  Result Value Ref Range   DHEA-SO4 119.3 57.3 - 279.2 ug/dL  Magnesium  Result Value  Ref Range   Magnesium 2.3 1.6 - 2.3 mg/dL  Vitamin D 1,25 dihydroxy  Result Value Ref Range   Vitamin D 1, 25 (OH)2 Total 31 pg/mL   Vitamin D2 1, 25 (OH)2 <10 pg/mL   Vitamin D3 1, 25 (OH)2 30 pg/mL      Assessment & Plan:   Problem List Items Addressed This Visit      Unprioritized   Anxiety    Pt is better, but still with anxiety.  Do not change Escitalopram for now.  Discussed counseling.            Follow up plan: Return in about 3 months (around 08/04/2017).

## 2017-05-05 NOTE — Assessment & Plan Note (Signed)
Pt is better, but still with anxiety.  Do not change Escitalopram for now.  Discussed counseling.

## 2017-05-09 ENCOUNTER — Other Ambulatory Visit: Payer: Self-pay | Admitting: Unknown Physician Specialty

## 2017-07-26 ENCOUNTER — Encounter: Payer: Self-pay | Admitting: Unknown Physician Specialty

## 2017-08-04 ENCOUNTER — Ambulatory Visit: Payer: 59 | Admitting: Unknown Physician Specialty

## 2017-08-11 ENCOUNTER — Encounter: Payer: Self-pay | Admitting: Unknown Physician Specialty

## 2017-08-11 ENCOUNTER — Ambulatory Visit: Payer: 59 | Admitting: Unknown Physician Specialty

## 2017-08-11 DIAGNOSIS — F419 Anxiety disorder, unspecified: Secondary | ICD-10-CM | POA: Diagnosis not present

## 2017-08-11 DIAGNOSIS — K219 Gastro-esophageal reflux disease without esophagitis: Secondary | ICD-10-CM | POA: Diagnosis not present

## 2017-08-11 MED ORDER — ESCITALOPRAM OXALATE 20 MG PO TABS: 20.0000 mg | ORAL_TABLET | Freq: Every day | ORAL | 2 refills | Status: DC

## 2017-08-11 NOTE — Assessment & Plan Note (Addendum)
Still having anxiety, but improved.  Increase Lexapro to 20 mg.  Discussed further reading.  Not ready to go to counseling.  Discussed

## 2017-08-11 NOTE — Assessment & Plan Note (Addendum)
New diagnosis.  Consider adding PPI.  Pt ed on reflux

## 2017-08-11 NOTE — Progress Notes (Signed)
BP 107/72   Pulse 69   Temp (!) 97.5 F (36.4 C) (Oral)   Wt 153 lb 12.8 oz (69.8 kg)   SpO2 98%   BMI 26.82 kg/m    Subjective:    Patient ID: Holly Powell, female    DOB: 11/23/1975, 42 y.o.   MRN: [id number]  HPI: Holly Powell is a 42 y.o. female  Chief Complaint  Patient presents with  . Anxiety    3 month f/up   Anxiety Pt states her anxiety is still there.  It does continue to prevent her from driving on the interstates.  States she "pushes through it."  She does avoid things.  She is reading some books.   Depression screen [facility] 2/9 08/11/2017 05/05/2017 03/31/2017 09/19/2016  Decreased Interest 0 0 0 0  Down, Depressed, Hopeless 0 0 0 0  PHQ - 2 Score 0 0 0 0  Altered sleeping 0 1 0 -  Tired, decreased energy 0 1 2 -  Change in appetite 2 2 0 -  Feeling bad or failure about yourself  0 0 0 -  Trouble concentrating 1 2 2  -  Moving slowly or fidgety/restless 0 1 2 -  Suicidal thoughts 0 0 0 -  PHQ-9 Score 3 7 6  -   GERD Increased indigestion lately.  Not sure why  Relevant past medical, surgical, family and social history reviewed and updated as indicated. Interim medical history since our last visit reviewed. Allergies and medications reviewed and updated.  Review of Systems  Per HPI unless specifically indicated above     Objective:    BP 107/72   Pulse 69   Temp (!) 97.5 F (36.4 C) (Oral)   Wt 153 lb 12.8 oz (69.8 kg)   SpO2 98%   BMI 26.82 kg/m   Wt Readings from Last 3 Encounters:  08/11/17 153 lb 12.8 oz (69.8 kg)  05/05/17 147 lb 6.4 oz (66.9 kg)  03/31/17 148 lb 3.2 oz (67.2 kg)    Physical Exam  Constitutional: She is oriented to person, place, and time. She appears well-developed and well-nourished. No distress.  HENT:  Head: Normocephalic and atraumatic.  Eyes: Conjunctivae and lids are normal. Right eye exhibits no discharge. Left eye exhibits no discharge. No scleral icterus.  Neck: Normal range of motion. Neck supple. No JVD  present. Carotid bruit is not present.  Cardiovascular: Normal rate, regular rhythm and normal heart sounds.  Pulmonary/Chest: Effort normal and breath sounds normal.  Abdominal: Normal appearance. There is no splenomegaly or hepatomegaly.  Musculoskeletal: Normal range of motion.  Neurological: She is alert and oriented to person, place, and time.  Skin: Skin is warm, dry and intact. No rash noted. No pallor.  Psychiatric: She has a normal mood and affect. Her behavior is normal. Judgment and thought content normal.    Results for orders placed or performed in visit on 01/24/17  T3, free  Result Value Ref Range   T3, Free 2.3 2.0 - 4.4 pg/mL  T3, reverse  Result Value Ref Range   Reverse T3, Serum 22.1 9.2 - 24.1 ng/dL  Ferritin  Result Value Ref Range   Ferritin 42 15 - 150 ng/mL  Copper, Free  Result Value Ref Range   Copper, Free CANCELED mcg/L  Zinc  Result Value Ref Range   Zinc CANCELED ug/dL  DHEA-sulfate  Result Value Ref Range   DHEA-SO4 119.3 57.3 - 279.2 ug/dL  Magnesium  Result Value Ref Range  Magnesium 2.3 1.6 - 2.3 mg/dL  Vitamin D 1,25 dihydroxy  Result Value Ref Range   Vitamin D 1, 25 (OH)2 Total 31 pg/mL   Vitamin D2 1, 25 (OH)2 <10 pg/mL   Vitamin D3 1, 25 (OH)2 30 pg/mL      Assessment & Plan:   Problem List Items Addressed This Visit      Unprioritized   Anxiety    Still having anxiety, but improved.  Increase Lexapro to 20 mg.  Discussed further reading.  Not ready to go to counseling.  Discussed      Relevant Medications   escitalopram (LEXAPRO) 20 MG tablet   GERD (gastroesophageal reflux disease)    New diagnosis.  Consider adding PPI.  Pt ed on reflux      Relevant Medications   famotidine (PEPCID) 20 MG tablet       Follow up plan: Return in about 6 months (around 02/08/2018).

## 2017-08-11 NOTE — Patient Instructions (Addendum)
[employer] flower blend - Rescue Remedy  10 REFLUX RULES TO LIVE BY---  1. Limit liquids to no more than 6oz/hour. (A liquid includes smoothies, yogurt, soup, ice cream, etc).   2. STOP ALL LIQUIDS 3 hours before bed.   3. Stop all solid food 4 hours before bedtime.   4. Move bedtimes meds to evening meal (except for sleeping medications).   5. High fat foods slow gastric movement, if you are going to eat something with high fat, eat it for lunch, and not dinner.   6. Sleep elevated, preferably on an adjustable bed with upper body elevated between 30-45 degree and the knees slightly elevated and flexed. (Sleep on a few pillows is not nearly enough!).   7. NEVER sleep on your right side or stomach-- switch sides of the bed if necessary so you wont sleep on your right side.   8. Avoid these products as tey ALL will make silent reflux worse for several hours: Alcohol, caffeine, carobated beverages, chocolate, fatty foods, tomato sauces.   9. Remember that there is NO medication that stops reflux! Medications only lower the acid content in the stomach juices.   10. Only CARBOHYDRATES, GRAINS, AND STARCHES absorb the stomach liquids so they will not easily reflux high enough to be aspirated.

## 2017-09-25 ENCOUNTER — Encounter: Payer: Self-pay | Admitting: Unknown Physician Specialty

## 2017-09-25 ENCOUNTER — Ambulatory Visit (INDEPENDENT_AMBULATORY_CARE_PROVIDER_SITE_OTHER): Payer: 59 | Admitting: Unknown Physician Specialty

## 2017-09-25 VITALS — BP 101/68 | HR 73 | Temp 98.2°F | Ht 63.6 in | Wt 152.9 lb

## 2017-09-25 DIAGNOSIS — K219 Gastro-esophageal reflux disease without esophagitis: Secondary | ICD-10-CM | POA: Diagnosis not present

## 2017-09-25 DIAGNOSIS — Z0000 Encounter for general adult medical examination without abnormal findings: Secondary | ICD-10-CM | POA: Diagnosis not present

## 2017-09-25 DIAGNOSIS — F419 Anxiety disorder, unspecified: Secondary | ICD-10-CM | POA: Diagnosis not present

## 2017-09-25 NOTE — Assessment & Plan Note (Signed)
Stable, continue present medications.   

## 2017-09-25 NOTE — Progress Notes (Signed)
BP 101/68   Pulse 73   Temp 98.2 F (36.8 C) (Oral)   Ht 5' 3.6" (1.615 m)   Wt 152 lb 14.4 oz (69.4 kg)   SpO2 100%   BMI 26.58 kg/m    Subjective:    Patient ID: Holly Powell, female    DOB: May 25, 1976, 42 y.o.   MRN: [id number]  HPI: Holly Powell is a 42 y.o. female  Chief Complaint  Patient presents with  . Annual Exam    pt wants to get PAP at [place] it is about the same, but hasn't pushed herself.  Taking Lexapro 20 mg daily.   Depression screen [facility] 2/9 09/25/2017 08/11/2017 05/05/2017 03/31/2017 09/19/2016  Decreased Interest 0 0 0 0 0  Down, Depressed, Hopeless 0 0 0 0 0  PHQ - 2 Score 0 0 0 0 0  Altered sleeping 1 0 1 0 -  Tired, decreased energy 0 0 1 2 -  Change in appetite 1 2 2  0 -  Feeling bad or failure about yourself  0 0 0 0 -  Trouble concentrating 0 1 2 2  -  Moving slowly or fidgety/restless 0 0 1 2 -  Suicidal thoughts 0 0 0 0 -  PHQ-9 Score 2 3 7 6  -   GERD Takes daily OTC medication.  Working with a functional medical doctor.    Social History   Socioeconomic History  . Marital status: Married    Spouse name: Not on file  . Number of children: Not on file  . Years of education: Not on file  . Highest education level: Not on file  Social Needs  . Financial resource strain: Not on file  . Food insecurity - worry: Not on file  . Food insecurity - inability: Not on file  . Transportation needs - medical: Not on file  . Transportation needs - non-medical: Not on file  Occupational History  . Not on file  Tobacco Use  . Smoking status: Never Smoker  . Smokeless tobacco: Never Used  Substance and Sexual Activity  . Alcohol use: No  . Drug use: No  . Sexual activity: Yes    Birth control/protection: None  Other Topics Concern  . Not on file  Social History Narrative  . Not on file   Family History  Problem Relation Age of Onset  . Diabetes Mother   . Hypertension Mother   . Pancreatitis Mother   . Heart disease  Maternal Grandfather   . Diabetes Son        type 1  . Cancer Neg Hx   . Stroke Neg Hx   . COPD Neg Hx    Past Medical History:  Diagnosis Date  . GERD (gastroesophageal reflux disease)   . Pyelonephritis 2002   history of in2002 pregnancy   Past Surgical History:  Procedure Laterality Date  . BREAST ENHANCEMENT SURGERY  2011    Relevant past medical, surgical, family and social history reviewed and updated as indicated. Interim medical history since our last visit reviewed. Allergies and medications reviewed and updated.  Review of Systems  Constitutional: Negative.   HENT: Negative.   Eyes: Negative.   Respiratory: Negative.   Cardiovascular: Negative.   Gastrointestinal: Negative.   Endocrine: Negative.   Genitourinary: Negative.   Musculoskeletal: Negative.   Skin: Negative.   Allergic/Immunologic: Negative.   Neurological: Negative.   Hematological: Negative.   Psychiatric/Behavioral: Negative.     Per HPI unless  specifically indicated above     Objective:    BP 101/68   Pulse 73   Temp 98.2 F (36.8 C) (Oral)   Ht 5' 3.6" (1.615 m)   Wt 152 lb 14.4 oz (69.4 kg)   SpO2 100%   BMI 26.58 kg/m   Wt Readings from Last 3 Encounters:  09/25/17 152 lb 14.4 oz (69.4 kg)  08/11/17 153 lb 12.8 oz (69.8 kg)  05/05/17 147 lb 6.4 oz (66.9 kg)    Physical Exam  Constitutional: She is oriented to person, place, and time. She appears well-developed and well-nourished. No distress.  HENT:  Head: Normocephalic and atraumatic.  Eyes: Conjunctivae and lids are normal. Right eye exhibits no discharge. Left eye exhibits no discharge. No scleral icterus.  Neck: Normal range of motion. Neck supple. No JVD present. Carotid bruit is not present.  Cardiovascular: Normal rate, regular rhythm and normal heart sounds.  Pulmonary/Chest: Effort normal and breath sounds normal.  Abdominal: Normal appearance. There is no splenomegaly or hepatomegaly.  Musculoskeletal: Normal  range of motion.  Neurological: She is alert and oriented to person, place, and time.  Skin: Skin is warm, dry and intact. No rash noted. No pallor.  Psychiatric: She has a normal mood and affect. Her behavior is normal. Judgment and thought content normal.   Had labs outside the office.  Reviewed and were normal    Assessment & Plan:   Problem List Items Addressed This Visit      Unprioritized   Anxiety    Stable, continue present medications.        GERD (gastroesophageal reflux disease)    Stable, continue present medications.         Other Visit Diagnoses    Annual physical exam    -  Primary      Health maintenance: Refused Influenza Wants to get pap at [facility]  Follow up plan: Return in about 6 months (around 03/25/2018).

## 2017-11-04 ENCOUNTER — Other Ambulatory Visit: Payer: Self-pay | Admitting: Unknown Physician Specialty

## 2017-11-07 ENCOUNTER — Other Ambulatory Visit: Payer: Self-pay | Admitting: Unknown Physician Specialty

## 2017-11-10 ENCOUNTER — Encounter: Payer: Self-pay | Admitting: Unknown Physician Specialty

## 2017-11-10 ENCOUNTER — Ambulatory Visit (INDEPENDENT_AMBULATORY_CARE_PROVIDER_SITE_OTHER): Payer: 59 | Admitting: Unknown Physician Specialty

## 2017-11-10 DIAGNOSIS — K219 Gastro-esophageal reflux disease without esophagitis: Secondary | ICD-10-CM

## 2017-11-10 DIAGNOSIS — F419 Anxiety disorder, unspecified: Secondary | ICD-10-CM | POA: Diagnosis not present

## 2017-11-10 MED ORDER — ESCITALOPRAM OXALATE 20 MG PO TABS: 20.0000 mg | ORAL_TABLET | Freq: Every day | ORAL | 1 refills | Status: DC

## 2017-11-10 NOTE — Assessment & Plan Note (Signed)
Anxiety seems to be healing with the help of Escitalopram 20 mg daily.  Also seeing functional medicine physician and feels she is getting improved care.

## 2017-11-10 NOTE — Progress Notes (Signed)
BP 110/72   Pulse 74   Temp 97.7 F (36.5 C) (Oral)   Ht 5' 3.6" (1.615 m)   Wt 161 lb 9.6 oz (73.3 kg)   SpO2 99%   BMI 28.09 kg/m    Subjective:    Patient ID: Holly Powell, female    DOB: 08/19/1975, 42 y.o.   MRN: [id number]  HPI: Holly Powell is a 42 y.o. female  Chief Complaint  Patient presents with  . Anxiety    3 month f/up   Anxiety Taking Escitalopram 20 mg.  States it is "under the surface."  She [place] able [place] which has been a trigger.   Depression screen [facility] 2/9 11/10/2017 09/25/2017 08/11/2017 05/05/2017 03/31/2017  Decreased Interest 0 0 0 0 0  Down, Depressed, Hopeless 0 0 0 0 0  PHQ - 2 Score 0 0 0 0 0  Altered sleeping 0 1 0 1 0  Tired, decreased energy 0 0 0 1 2  Change in appetite 2 1 2 2  0  Feeling bad or failure about yourself  0 0 0 0 0  Trouble concentrating 1 0 1 2 2   Moving slowly or fidgety/restless 0 0 0 1 2  Suicidal thoughts 0 0 0 0 0  PHQ-9 Score 3 2 3 7 6    Dysbiosis Diagnosed through functional medicine doctor.  She is on supplements including Arabinex, L-Glutimate, and BPP  Relevant past medical, surgical, family and social history reviewed and updated as indicated. Interim medical history since our last visit reviewed. Allergies and medications reviewed and updated.  Review of Systems  Per HPI unless specifically indicated above     Objective:    BP 110/72   Pulse 74   Temp 97.7 F (36.5 C) (Oral)   Ht 5' 3.6" (1.615 m)   Wt 161 lb 9.6 oz (73.3 kg)   SpO2 99%   BMI 28.09 kg/m   Wt Readings from Last 3 Encounters:  11/10/17 161 lb 9.6 oz (73.3 kg)  09/25/17 152 lb 14.4 oz (69.4 kg)  08/11/17 153 lb 12.8 oz (69.8 kg)    Physical Exam  Constitutional: She is oriented to person, place, and time. She appears well-developed and well-nourished. No distress.  HENT:  Head: Normocephalic and atraumatic.  Eyes: Conjunctivae and lids are normal. Right eye exhibits no discharge. Left eye  exhibits no discharge. No scleral icterus.  Neck: Normal range of motion. Neck supple. No JVD present. Carotid bruit is not present.  Cardiovascular: Normal rate, regular rhythm and normal heart sounds.  Pulmonary/Chest: Effort normal and breath sounds normal.  Abdominal: Normal appearance. There is no splenomegaly or hepatomegaly.  Musculoskeletal: Normal range of motion.  Neurological: She is alert and oriented to person, place, and time.  Skin: Skin is warm, dry and intact. No rash noted. No pallor.  Psychiatric: She has a normal mood and affect. Her behavior is normal. Judgment and thought content normal.    Results for orders placed or performed in visit on 01/24/17  T3, free  Result Value Ref Range   T3, Free 2.3 2.0 - 4.4 pg/mL  T3, reverse  Result Value Ref Range   Reverse T3, Serum 22.1 9.2 - 24.1 ng/dL  Ferritin  Result Value Ref Range   Ferritin 42 15 - 150 ng/mL  Copper, Free  Result Value Ref Range   Copper, Free CANCELED mcg/L  Zinc  Result Value Ref Range   Zinc CANCELED ug/dL  DHEA-sulfate  Result Value Ref Range   DHEA-SO4 119.3 57.3 - 279.2 ug/dL  Magnesium  Result Value Ref Range   Magnesium 2.3 1.6 - 2.3 mg/dL  Vitamin D 1,25 dihydroxy  Result Value Ref Range   Vitamin D 1, 25 (OH)2 Total 31 pg/mL   Vitamin D2 1, 25 (OH)2 <10 pg/mL   Vitamin D3 1, 25 (OH)2 30 pg/mL      Assessment & Plan:   Problem List Items Addressed This Visit      Unprioritized   Anxiety    Anxiety seems to be healing with the help of Escitalopram 20 mg daily.  Also seeing functional medicine physician and feels she is getting improved care.       Relevant Medications   escitalopram (LEXAPRO) 20 MG tablet   GERD (gastroesophageal reflux disease)    Leaky gut and dysbyosis diagnosed through functional medicine.  Doing better with current supplements.  Labs reviewed and will follow          Follow up plan: Return in about 6 months (around 05/12/2018).

## 2017-11-10 NOTE — Assessment & Plan Note (Signed)
Leaky gut and dysbyosis diagnosed through functional medicine.  Doing better with current supplements.  Labs reviewed and will follow

## 2018-01-02 ENCOUNTER — Telehealth: Payer: Self-pay | Admitting: Unknown Physician Specialty

## 2018-01-02 NOTE — Telephone Encounter (Signed)
Spoke with pt and she will be going to the health dept to get TB skin test done and will pick up the report then.

## 2018-01-02 NOTE — Telephone Encounter (Signed)
Copied from [place] [id number]. Topic: Quick Communication - See Telephone Encounter >> Jan 02, 2018  8:42 AM Cleaster Corin, NT wrote: CRM for notification. See Telephone encounter for: 01/02/18.  Pt. States that she needs immuzation's for clinicals but doesn't know what she has already had. Pt contact number  [phone]

## 2018-01-24 ENCOUNTER — Encounter: Payer: Self-pay | Admitting: Unknown Physician Specialty

## 2018-01-31 ENCOUNTER — Ambulatory Visit: Payer: 59 | Admitting: Unknown Physician Specialty

## 2018-04-09 NOTE — Progress Notes (Signed)
Last PAP: 04/04/12 - Normal  Influenza vaccine -

## 2018-04-10 ENCOUNTER — Encounter: Payer: Self-pay | Admitting: Obstetrics & Gynecology

## 2018-04-10 ENCOUNTER — Ambulatory Visit (INDEPENDENT_AMBULATORY_CARE_PROVIDER_SITE_OTHER): Payer: 59 | Admitting: Obstetrics & Gynecology

## 2018-04-10 VITALS — BP 105/70 | HR 75 | Resp 16 | Ht 63.5 in | Wt 173.2 lb

## 2018-04-10 DIAGNOSIS — Z1151 Encounter for screening for human papillomavirus (HPV): Secondary | ICD-10-CM

## 2018-04-10 DIAGNOSIS — Z124 Encounter for screening for malignant neoplasm of cervix: Secondary | ICD-10-CM

## 2018-04-10 DIAGNOSIS — Z1231 Encounter for screening mammogram for malignant neoplasm of breast: Secondary | ICD-10-CM

## 2018-04-10 DIAGNOSIS — Z01419 Encounter for gynecological examination (general) (routine) without abnormal findings: Secondary | ICD-10-CM

## 2018-04-10 NOTE — Progress Notes (Signed)
GYNECOLOGY ANNUAL PREVENTATIVE CARE ENCOUNTER NOTE  Subjective:   Holly Powell is a 42 y.o. [id number] female here for a routine annual gynecologic exam.  Current complaints: none.   Denies abnormal vaginal bleeding, discharge, pelvic pain, problems with intercourse or other gynecologic concerns.    Gynecologic History Patient's last menstrual period was 03/25/2018 (approximate). Contraception: vasectomy Last Pap: 03/30/17. Results were: normal with negative HPV Last mammogram: Never had one.  Obstetric History OB History  Gravida Para Term Preterm AB Living  5 5 5     5   SAB TAB Ectopic Multiple Live Births          5    # Outcome Date GA Lbr Len/2nd Weight Sex Delivery Anes PTL Lv  5 Term 11/08/12 [id number] / 01:01 7 lb 9 oz (3.43 kg) M Vag-Spont EPI  LIV  4 Term 2005    M  None  LIV     Birth Comments: Patient delivered in 3 hours, she barely made it to the hospital.  3 Term 2002    M Vag-Spont EPI  LIV  2 Term 1996    M Vag-Spont EPI  LIV  1 Term 1994    M Vag-Spont EPI  LIV    Past Medical History:  Diagnosis Date  . GERD (gastroesophageal reflux disease)   . Pyelonephritis 2002   history of in2002 pregnancy  . Type O blood, Rh negative     Past Surgical History:  Procedure Laterality Date  . BREAST ENHANCEMENT SURGERY  2011    Current Outpatient Medications on File Prior to Visit  Medication Sig Dispense Refill  . clobetasol ointment (TEMOVATE) 04.33% Apply 1 application topically 2 (two) times daily. 30 g 0  . COPPER PO Take 1 tablet by mouth daily.    .Marland Kitchenescitalopram (LEXAPRO) 20 MG tablet Take 1 tablet (20 mg total) by mouth daily. 90 tablet 1  . LORazepam (ATIVAN) 0.5 MG tablet Take 1 tablet (0.5 mg total) by mouth at bedtime as needed for anxiety. 30 tablet 1  . PREVIDENT 5000 BOOSTER PLUS 1.1 % PSTE daily.     . Probiotic Product (PROBIOTIC PO) Take 1 tablet by mouth daily.    . progesterone (PROMETRIUM) 100 MG capsule Take 1 capsule by mouth daily.      .Marland KitchenUNABLE TO FIND Take 2 tablets by mouth daily. Thyroid Supplement Complex    . UNABLE TO FIND Thorne- BPP, 2 tablets daily    . UNABLE TO FIND Arabinex powder    . UNABLE TO FIND L-Glutamine Powder    . Vitamin D-Vitamin K (D3 + K2 DOTS PO) Take 5 drops by mouth daily.     No current facility-administered medications on file prior to visit.     Allergies  Allergen Reactions  . Penicillins Rash    Social History:  reports that she has never smoked. She has never used smokeless tobacco. She reports that she does not drink alcohol or use drugs.  Family History  Problem Relation Age of Onset  . Diabetes Mother   . Hypertension Mother   . Pancreatitis Mother   . Heart disease Maternal Grandfather   . Diabetes Son        type 1  . Cancer Neg Hx   . Stroke Neg Hx   . COPD Neg Hx     The following portions of the patient's history were reviewed and updated as appropriate: allergies, current medications, past family history, past medical  history, past social history, past surgical history and problem list.  Review of Systems Pertinent items noted in HPI and remainder of comprehensive ROS otherwise negative.   Objective:  BP 105/70 (BP Location: Left Arm, Patient Position: Sitting, Cuff Size: Normal)   Pulse 75   Resp 16   Ht 5' 3.5" (1.613 m)   Wt 173 lb 3.2 oz (78.6 kg)   LMP 03/25/2018 (Approximate)   BMI 30.20 kg/m  CONSTITUTIONAL: Well-developed, well-nourished female in no acute distress.  HENT:  Normocephalic, atraumatic, External right and left ear normal. Oropharynx is clear and moist EYES: Conjunctivae and EOM are normal. Pupils are equal, round, and reactive to light. No scleral icterus.  NECK: Normal range of motion, supple, no masses.  Normal thyroid.  SKIN: Skin is warm and dry. No rash noted. Not diaphoretic. No erythema. No pallor. MUSCULOSKELETAL: Normal range of motion. No tenderness.  No cyanosis, clubbing, or edema.  2+ distal pulses. NEUROLOGIC: Alert  and oriented to person, place, and time. Normal reflexes, muscle tone coordination. No cranial nerve deficit noted. PSYCHIATRIC: Normal mood and affect. Normal behavior. Normal judgment and thought content. CARDIOVASCULAR: Normal heart rate noted, regular rhythm RESPIRATORY: Clear to auscultation bilaterally. Effort and breath sounds normal, no problems with respiration noted. BREASTS: Symmetric in size, implants in place. No masses, skin changes, nipple drainage, or lymphadenopathy. ABDOMEN: Soft, normal bowel sounds, no distention noted.  No tenderness, rebound or guarding.  PELVIC: Normal appearing external genitalia; normal appearing vaginal mucosa and cervix.  No abnormal discharge noted.  Pap smear obtained.  Normal uterine size, no other palpable masses, no uterine or adnexal tenderness.   Assessment and Plan:  1. Breast cancer screening by mammogram Mammogram scheduled - MM 3D SCREEN BREAST W/IMPLANT BILATERAL; Future  2. Encounter for gynecological examination with Papanicolaou smear of cervix - Cytology - PAP Will follow up results of pap smear and manage accordingly. Routine preventative health maintenance measures emphasized. Please refer to After Visit Summary for other counseling recommendations.    Verita Schneiders, MD, [place] for [employer], [place]

## 2018-04-10 NOTE — Patient Instructions (Signed)
Preventive Care 18-39 Years, Female Preventive care refers to lifestyle choices and visits with your health care provider that can promote health and wellness. What does preventive care include?  A yearly physical exam. This is also called an annual well check.  Dental exams once or twice a year.  Routine eye exams. Ask your health care provider how often you should have your eyes checked.  Personal lifestyle choices, including: ? Daily care of your teeth and gums. ? Regular physical activity. ? Eating a healthy diet. ? Avoiding tobacco and drug use. ? Limiting alcohol use. ? Practicing safe sex. ? Taking vitamin and mineral supplements as recommended by your health care provider. What happens during an annual well check? The services and screenings done by your health care provider during your annual well check will depend on your age, overall health, lifestyle risk factors, and family history of disease. Counseling Your health care provider may ask you questions about your:  Alcohol use.  Tobacco use.  Drug use.  Emotional well-being.  Home and relationship well-being.  Sexual activity.  Eating habits.  Work and work Statistician.  Method of birth control.  Menstrual cycle.  Pregnancy history.  Screening You may have the following tests or measurements:  Height, weight, and BMI.  Diabetes screening. This is done by checking your blood sugar (glucose) after you have not eaten for a while (fasting).  Blood pressure.  Lipid and cholesterol levels. These may be checked every 5 years starting at age 66.  Skin check.  Hepatitis C blood test.  Hepatitis B blood test.  Sexually transmitted disease (STD) testing.  BRCA-related cancer screening. This may be done if you have a family history of breast, ovarian, tubal, or peritoneal cancers.  Pelvic exam and Pap test. This may be done every 3 years starting at age 40. Starting at age 59, this may be done every 5  years if you have a Pap test in combination with an HPV test.  Discuss your test results, treatment options, and if necessary, the need for more tests with your health care provider. Vaccines Your health care provider may recommend certain vaccines, such as:  Influenza vaccine. This is recommended every year.  Tetanus, diphtheria, and acellular pertussis (Tdap, Td) vaccine. You may need a Td booster every 10 years.  Varicella vaccine. You may need this if you have not been vaccinated.  HPV vaccine. If you are 69 or younger, you may need three doses over 6 months.  Measles, mumps, and rubella (MMR) vaccine. You may need at least one dose of MMR. You may also need a second dose.  Pneumococcal 13-valent conjugate (PCV13) vaccine. You may need this if you have certain conditions and were not previously vaccinated.  Pneumococcal polysaccharide (PPSV23) vaccine. You may need one or two doses if you smoke cigarettes or if you have certain conditions.  Meningococcal vaccine. One dose is recommended if you are age 27-21 years and a first-year college student living in a residence hall, or if you have one of several medical conditions. You may also need additional booster doses.  Hepatitis A vaccine. You may need this if you have certain conditions or if you travel or work in places where you may be exposed to hepatitis A.  Hepatitis B vaccine. You may need this if you have certain conditions or if you travel or work in places where you may be exposed to hepatitis B.  Haemophilus influenzae type b (Hib) vaccine. You may need this if  you have certain risk factors.  Talk to your health care provider about which screenings and vaccines you need and how often you need them. This information is not intended to replace advice given to you by your health care provider. Make sure you discuss any questions you have with your health care provider. Document Released: 09/20/2001 Document Revised: 04/13/2016  Document Reviewed: 05/26/2015 Elsevier Interactive Patient Education  [employer].

## 2018-04-13 LAB — CYTOLOGY - PAP
DIAGNOSIS: NEGATIVE
HPV (WINDOPATH): NOT DETECTED

## 2018-04-25 ENCOUNTER — Ambulatory Visit
Admission: RE | Admit: 2018-04-25 | Discharge: 2018-04-25 | Disposition: A | Discharge status: Home / Self Care | Payer: 59 | Source: Ambulatory Visit | Attending: Obstetrics & Gynecology | Admitting: Obstetrics & Gynecology

## 2018-04-25 DIAGNOSIS — Z1231 Encounter for screening mammogram for malignant neoplasm of breast: Secondary | ICD-10-CM | POA: Insufficient documentation

## 2018-05-11 ENCOUNTER — Encounter: Payer: Self-pay | Admitting: Unknown Physician Specialty

## 2018-05-11 ENCOUNTER — Ambulatory Visit: Payer: 59 | Admitting: Unknown Physician Specialty

## 2018-05-11 VITALS — BP 98/61 | HR 83 | Temp 97.8°F | Ht 63.0 in | Wt 175.1 lb

## 2018-05-11 DIAGNOSIS — Z23 Encounter for immunization: Secondary | ICD-10-CM | POA: Diagnosis not present

## 2018-05-11 DIAGNOSIS — F419 Anxiety disorder, unspecified: Secondary | ICD-10-CM

## 2018-05-11 DIAGNOSIS — F410 Panic disorder [episodic paroxysmal anxiety] without agoraphobia: Secondary | ICD-10-CM

## 2018-05-11 MED ORDER — ESCITALOPRAM OXALATE 20 MG PO TABS: 20.0000 mg | ORAL_TABLET | Freq: Every day | ORAL | 1 refills | Status: DC

## 2018-05-11 NOTE — Assessment & Plan Note (Signed)
Stable, continue present medications.   

## 2018-05-11 NOTE — Progress Notes (Signed)
BP 98/61 (BP Location: Left Arm, Patient Position: Sitting, Cuff Size: Normal)   Pulse 83   Temp 97.8 F (36.6 C) (Oral)   Ht 5' 3"  (1.6 m)   Wt 175 lb 1.6 oz (79.4 kg)   LMP 04/21/2018   SpO2 98%   BMI 31.02 kg/m    Subjective:    Patient ID: Holly Powell, female    DOB: [birth year], 42 y.o.   MRN: [id number]  HPI: Holly Powell is a 42 y.o. female  Chief Complaint  Patient presents with  . Anxiety    6 month F/U   Anxiety Pt is here to f/u on her Lexapro 20 mg.  Anxiety is about the same.  She feels if she can heal the gut, she will get better.  For now she is stable.  Her main problem is driving.  [place] on the interstate.  Almost had an anxiety attack.    Recently updated pap and mammogram   Relevant past medical, surgical, family and social history reviewed and updated as indicated. Interim medical history since our last visit reviewed. Allergies and medications reviewed and updated.  Review of Systems  Constitutional: Negative.   Respiratory: Negative.   Cardiovascular: Negative.   Psychiatric/Behavioral: Negative.     Per HPI unless specifically indicated above     Objective:    BP 98/61 (BP Location: Left Arm, Patient Position: Sitting, Cuff Size: Normal)   Pulse 83   Temp 97.8 F (36.6 C) (Oral)   Ht 5' 3"  (1.6 m)   Wt 175 lb 1.6 oz (79.4 kg)   LMP 04/21/2018   SpO2 98%   BMI 31.02 kg/m   Wt Readings from Last 3 Encounters:  05/11/18 175 lb 1.6 oz (79.4 kg)  04/10/18 173 lb 3.2 oz (78.6 kg)  11/10/17 161 lb 9.6 oz (73.3 kg)    Physical Exam  Constitutional: She is oriented to person, place, and time. She appears well-developed and well-nourished. No distress.  HENT:  Head: Normocephalic and atraumatic.  Eyes: Conjunctivae and lids are normal. Right eye exhibits no discharge. Left eye exhibits no discharge. No scleral icterus.  Neck: Normal range of motion. Neck supple. No JVD present. Carotid bruit is not present.    Cardiovascular: Normal rate, regular rhythm and normal heart sounds.  Pulmonary/Chest: Effort normal and breath sounds normal.  Abdominal: Normal appearance. There is no splenomegaly or hepatomegaly.  Musculoskeletal: Normal range of motion.  Neurological: She is alert and oriented to person, place, and time.  Skin: Skin is warm, dry and intact. No rash noted. No pallor.  Psychiatric: She has a normal mood and affect. Her behavior is normal. Judgment and thought content normal.    Results for orders placed or performed in visit on 04/10/18  Cytology - PAP  Result Value Ref Range   Adequacy      Satisfactory for evaluation  endocervical/transformation zone component PRESENT.   Diagnosis      NEGATIVE FOR INTRAEPITHELIAL LESIONS OR MALIGNANCY.   HPV NOT DETECTED    Material Submitted CervicoVaginal Pap [ThinPrep Imaged]    CYTOLOGY - PAP PAP RESULT       Assessment & Plan:   Problem List Items Addressed This Visit      Unprioritized   Anxiety    Stable, continue present medications.        Relevant Medications   escitalopram (LEXAPRO) 20 MG tablet   Panic    Stable, continue present medications.  Relevant Medications   escitalopram (LEXAPRO) 20 MG tablet    Other Visit Diagnoses    Need for influenza vaccination    -  Primary   Relevant Orders   Flu Vaccine QUAD 6+ mos PF IM (Fluarix Quad PF) (Completed)       Follow up plan: Return in about 6 months (around 11/10/2018).

## 2018-05-11 NOTE — Patient Instructions (Signed)

## 2018-11-06 ENCOUNTER — Other Ambulatory Visit: Payer: Self-pay | Admitting: Unknown Physician Specialty

## 2018-11-16 ENCOUNTER — Encounter: Payer: 59 | Admitting: Nurse Practitioner

## 2019-01-08 ENCOUNTER — Other Ambulatory Visit: Payer: Self-pay

## 2019-01-08 ENCOUNTER — Encounter: Payer: Self-pay | Admitting: Nurse Practitioner

## 2019-01-08 ENCOUNTER — Ambulatory Visit (INDEPENDENT_AMBULATORY_CARE_PROVIDER_SITE_OTHER): Payer: 59 | Admitting: Nurse Practitioner

## 2019-01-08 VITALS — BP 117/73 | HR 91 | Temp 98.2°F | Ht 63.39 in | Wt 180.0 lb

## 2019-01-08 DIAGNOSIS — Z0000 Encounter for general adult medical examination without abnormal findings: Secondary | ICD-10-CM

## 2019-01-08 MED ORDER — ESCITALOPRAM OXALATE 20 MG PO TABS: ORAL_TABLET | ORAL | 2 refills | Status: DC

## 2019-01-08 NOTE — Progress Notes (Signed)
BP 117/73   Pulse 91   Temp 98.2 F (36.8 C) (Oral)   Ht 5' 3.39" (1.61 m)   Wt 180 lb (81.6 kg)   SpO2 97%   BMI 31.50 kg/m    Subjective:    Patient ID: Holly Powell, female    DOB: [birth year], 43 y.o.   MRN: [id number]  HPI: Holly Powell is a 43 y.o. female presenting on 01/08/2019 for comprehensive medical examination. Current medical complaints include:none  She currently lives with: spouse and children Menopausal Symptoms: no   Reports she goes to functional provider in [place], last saw her this morning via telephone.  Has labs done with her including, cholesterol screening.  Goes to GYN for pap and breast exams, last saw 04/10/2018.  ANXIETY/STRESS Continues on Lexapro 20 MG daily.  She reports hopes to come off of eventually.  Does endorse some sexual dysfunction with medication, which sometimes puts strain on her relationship.   Duration:controlled Anxious mood: no  Excessive worrying: no Irritability: no  Sweating: no Nausea: no Palpitations:no Hyperventilation: no Panic attacks: no Agoraphobia: no  Obscessions/compulsions: no Depressed mood: no Anhedonia: no Weight changes: no Insomnia: none Hypersomnia: no Fatigue/loss of energy: no Feelings of worthlessness: no Feelings of guilt: no Impaired concentration/indecisiveness: no Suicidal ideations: no  Crying spells: no Recent Stressors/Life Changes: no   Relationship problems: yes   Family stress: no     Financial stress: no    Job stress: no    Recent death/loss: no  GAD 7 : Generalized Anxiety Score 01/08/2019 05/11/2018 01/11/2017  Nervous, Anxious, on Edge 0 3 3  Control/stop worrying 0 0 3  Worry too much - different things 0 0 3  Trouble relaxing 1 0 3  Restless 1 0 3  Easily annoyed or irritable 1 1 0  Afraid - awful might happen 0 0 3  Total GAD 7 Score 3 4 18   Anxiety Difficulty Not difficult at all - Extremely difficult     Depression Screen done today and results listed below:   Depression screen [facility] 2/9 01/08/2019 11/10/2017 09/25/2017 08/11/2017 05/05/2017  Decreased Interest 0 0 0 0 0  Down, Depressed, Hopeless 0 0 0 0 0  PHQ - 2 Score 0 0 0 0 0  Altered sleeping 0 0 1 0 1  Tired, decreased energy 0 0 0 0 1  Change in appetite 0 2 1 2 2   Feeling bad or failure about yourself  0 0 0 0 0  Trouble concentrating 0 1 0 1 2  Moving slowly or fidgety/restless 0 0 0 0 1  Suicidal thoughts 0 0 0 0 0  PHQ-9 Score 0 3 2 3 7   Difficult doing work/chores Not difficult at all - - - -    The patient does not have a history of falls. I did not complete a risk assessment for falls. A plan of care for falls was not documented.   Past Medical History:  Past Medical History:  Diagnosis Date  . GERD (gastroesophageal reflux disease)   . Pyelonephritis 2002   history of in2002 pregnancy  . Type O blood, Rh negative     Surgical History:  Past Surgical History:  Procedure Laterality Date  . AUGMENTATION MAMMAPLASTY Bilateral 2011   saline  . BREAST ENHANCEMENT SURGERY  2011    Medications:  Current Outpatient Medications on File Prior to Visit  Medication Sig  . clobetasol ointment (TEMOVATE) 1.60 % Apply 1 application topically 2 (two) times  daily.  . COPPER PO Take 1 tablet by mouth daily.  Marland Kitchen PREVIDENT 5000 BOOSTER PLUS 1.1 % PSTE daily.   . Probiotic Product (PROBIOTIC PO) Take 1 tablet by mouth daily.  . progesterone (PROMETRIUM) 100 MG capsule Take 1 capsule by mouth daily.  Marland Kitchen UNABLE TO FIND Take 2 tablets by mouth daily. Thyroid Supplement Complex  . UNABLE TO FIND Thorne- BPP, 2 tablets daily  . UNABLE TO FIND Arabinex powder  . UNABLE TO FIND L-Glutamine Powder  . Vitamin D-Vitamin K (D3 + K2 DOTS PO) Take 5 drops by mouth daily.   No current facility-administered medications on file prior to visit.     Allergies:  Allergies  Allergen Reactions  . Penicillins Rash    Social History:  Social History   Socioeconomic History  . Marital status: Married     Spouse name: Not on file  . Number of children: Not on file  . Years of education: Not on file  . Highest education level: Not on file  Occupational History  . Not on file  Social Needs  . Financial resource strain: Not on file  . Food insecurity:    Worry: Not on file    Inability: Not on file  . Transportation needs:    Medical: Not on file    Non-medical: Not on file  Tobacco Use  . Smoking status: Never Smoker  . Smokeless tobacco: Never Used  Substance and Sexual Activity  . Alcohol use: No  . Drug use: No  . Sexual activity: Yes    Birth control/protection: None  Lifestyle  . Physical activity:    Days per week: Not on file    Minutes per session: Not on file  . Stress: Not on file  Relationships  . Social connections:    Talks on phone: Not on file    Gets together: Not on file    Attends religious service: Not on file    Active member of club or organization: Not on file    Attends meetings of clubs or organizations: Not on file    Relationship status: Not on file  . Intimate partner violence:    Fear of current or ex partner: Not on file    Emotionally abused: Not on file    Physically abused: Not on file    Forced sexual activity: Not on file  Other Topics Concern  . Not on file  Social History Narrative  . Not on file   Social History   Tobacco Use  Smoking Status Never Smoker  Smokeless Tobacco Never Used   Social History   Substance and Sexual Activity  Alcohol Use No    Family History:  Family History  Problem Relation Age of Onset  . Diabetes Mother   . Hypertension Mother   . Pancreatitis Mother   . Heart disease Maternal Grandfather   . Diabetes Son        type 1  . Cancer Neg Hx   . Stroke Neg Hx   . COPD Neg Hx     Past medical history, surgical history, medications, allergies, family history and social history reviewed with patient today and changes made to appropriate areas of the chart.   Review of Systems - Negative  All other ROS negative except what is listed above and in the HPI.      Objective:    BP 117/73   Pulse 91   Temp 98.2 F (36.8 C) (Oral)   Ht  5' 3.39" (1.61 m)   Wt 180 lb (81.6 kg)   SpO2 97%   BMI 31.50 kg/m   Wt Readings from Last 3 Encounters:  01/08/19 180 lb (81.6 kg)  05/11/18 175 lb 1.6 oz (79.4 kg)  04/10/18 173 lb 3.2 oz (78.6 kg)    Physical Exam Vitals signs and nursing note reviewed.  Constitutional:      General: She is awake. She is not in acute distress.    Appearance: She is well-developed. She is not ill-appearing.  HENT:     Head: Normocephalic.     Right Ear: Hearing, tympanic membrane, ear canal and external ear normal.     Left Ear: Hearing, tympanic membrane, ear canal and external ear normal.     Nose: Nose normal.     Mouth/Throat:     Mouth: Mucous membranes are moist.  Eyes:     General: Lids are normal.        Right eye: No discharge.        Left eye: No discharge.     Extraocular Movements: Extraocular movements intact.     Conjunctiva/sclera: Conjunctivae normal.     Pupils: Pupils are equal, round, and reactive to light.     Visual Fields: Right eye visual fields normal and left eye visual fields normal.  Neck:     Musculoskeletal: Normal range of motion and neck supple.     Thyroid: No thyromegaly.     Vascular: No carotid bruit or JVD.  Cardiovascular:     Rate and Rhythm: Normal rate and regular rhythm.     Heart sounds: Normal heart sounds. No murmur. No gallop.   Pulmonary:     Effort: Pulmonary effort is normal.     Breath sounds: Normal breath sounds.  Chest:     Comments: Deferred per patient request, due to recent GYN exam and mammogram. Abdominal:     General: Bowel sounds are normal.     Palpations: Abdomen is soft. There is no hepatomegaly or splenomegaly.  Musculoskeletal:     Right lower leg: No edema.     Left lower leg: No edema.  Lymphadenopathy:     Cervical: No cervical adenopathy.  Skin:    General: Skin  is warm and dry.  Neurological:     Mental Status: She is alert and oriented to person, place, and time.  Psychiatric:        Attention and Perception: Attention normal.        Mood and Affect: Mood normal.        Behavior: Behavior normal. Behavior is cooperative.        Thought Content: Thought content normal.        Judgment: Judgment normal.     Results for orders placed or performed in visit on 01/08/19  Microscopic Examination  Result Value Ref Range   WBC, UA 0-5 0 - 5 /hpf   RBC 0-2 0 - 2 /hpf   Epithelial Cells (non renal) 0-10 0 - 10 /hpf   Bacteria, UA Moderate (A) None seen/Few  Urine Culture, Reflex  Result Value Ref Range   Urine Culture, Routine WILL FOLLOW   UA/M w/rflx Culture, Routine  Result Value Ref Range   Specific Gravity, UA 1.020 1.005 - 1.030   pH, UA 5.0 5.0 - 7.5   Color, UA Yellow Yellow   Appearance Ur Clear Clear   Leukocytes,UA 2+ (A) Negative   Protein,UA Negative Negative/Trace   Glucose, UA Negative Negative  Ketones, UA Negative Negative   RBC, UA 1+ (A) Negative   Bilirubin, UA Negative Negative   Urobilinogen, Ur 0.2 0.2 - 1.0 mg/dL   Nitrite, UA Negative Negative   Microscopic Examination See below:    Urinalysis Reflex Comment       Assessment & Plan:   Problem List Items Addressed This Visit    None    Visit Diagnoses    Encounter for annual physical exam    -  Primary   Relevant Orders   CBC with Differential/Platelet   Comprehensive metabolic panel   UA/M w/rflx Culture, Routine (Completed)       Follow up plan: Return for annual physical.   LABORATORY TESTING:  - Pap smear: up to date  IMMUNIZATIONS:   - Tdap: Tetanus vaccination status reviewed: last tetanus booster within 10 years. - Influenza: Up to date - Pneumovax: Not applicable - Prevnar: Not applicable - HPV: Not applicable - Zostavax vaccine: Not applicable  SCREENING: -Mammogram: Up to date  - Colonoscopy: Not applicable  - Bone Density: Not  applicable  -Hearing Test: Not applicable  -Spirometry: Not applicable   PATIENT COUNSELING:   Advised to take 1 mg of folate supplement per day if capable of pregnancy.   Sexuality: Discussed sexually transmitted diseases, partner selection, use of condoms, avoidance of unintended pregnancy  and contraceptive alternatives.   Advised to avoid cigarette smoking.  I discussed with the patient that most people either abstain from alcohol or drink within safe limits (<=14/week and <=4 drinks/occasion for males, <=7/weeks and <= 3 drinks/occasion for females) and that the risk for alcohol disorders and other health effects rises proportionally with the number of drinks per week and how often a drinker exceeds daily limits.  Discussed cessation/primary prevention of drug use and availability of treatment for abuse.   Diet: Encouraged to adjust caloric intake to maintain  or achieve ideal body weight, to reduce intake of dietary saturated fat and total fat, to limit sodium intake by avoiding high sodium foods and not adding table salt, and to maintain adequate dietary potassium and calcium preferably from fresh fruits, vegetables, and low-fat dairy products.    stressed the importance of regular exercise  Injury prevention: Discussed safety belts, safety helmets, smoke detector, smoking near bedding or upholstery.   Dental health: Discussed importance of regular tooth brushing, flossing, and dental visits.    NEXT PREVENTATIVE PHYSICAL DUE IN 1 YEAR. Return for annual physical.

## 2019-01-08 NOTE — Patient Instructions (Signed)
Escitalopram tablets What is this medicine? ESCITALOPRAM (es sye TAL oh pram) is used to treat depression and certain types of anxiety. This medicine may be used for other purposes; ask your health care provider or pharmacist if you have questions. COMMON BRAND NAME(S): Lexapro What should I tell my health care provider before I take this medicine? They need to know if you have any of these conditions: -bipolar disorder or a family history of bipolar disorder -diabetes -glaucoma -heart disease -kidney or liver disease -receiving electroconvulsive therapy -seizures (convulsions) -suicidal thoughts, plans, or attempt by you or a family member -an unusual or allergic reaction to escitalopram, the related drug citalopram, other medicines, foods, dyes, or preservatives -pregnant or trying to become pregnant -breast-feeding How should I use this medicine? Take this medicine by mouth with a glass of water. Follow the directions on the prescription label. You can take it with or without food. If it upsets your stomach, take it with food. Take your medicine at regular intervals. Do not take it more often than directed. Do not stop taking this medicine suddenly except upon the advice of your doctor. Stopping this medicine too quickly may cause serious side effects or your condition may worsen. A special MedGuide will be given to you by the pharmacist with each prescription and refill. Be sure to read this information carefully each time. Talk to your pediatrician regarding the use of this medicine in children. Special care may be needed. Overdosage: If you think you have taken too much of this medicine contact a poison control center or emergency room at once. NOTE: This medicine is only for you. Do not share this medicine with others. What if I miss a dose? If you miss a dose, take it as soon as you can. If it is almost time for your next dose, take only that dose. Do not take double or extra  doses. What may interact with this medicine? Do not take this medicine with any of the following medications: -certain medicines for fungal infections like fluconazole, itraconazole, ketoconazole, posaconazole, voriconazole -cisapride -citalopram -dofetilide -dronedarone -linezolid -MAOIs like Carbex, Eldepryl, Marplan, Nardil, and Parnate -methylene blue (injected into a vein) -pimozide -thioridazine -ziprasidone This medicine may also interact with the following medications: -alcohol -amphetamines -aspirin and aspirin-like medicines -carbamazepine -certain medicines for depression, anxiety, or psychotic disturbances -certain medicines for migraine headache like almotriptan, eletriptan, frovatriptan, naratriptan, rizatriptan, sumatriptan, zolmitriptan -certain medicines for sleep -certain medicines that treat or prevent blood clots like warfarin, enoxaparin, dalteparin -cimetidine -diuretics -fentanyl -furazolidone -isoniazid -lithium -metoprolol -NSAIDs, medicines for pain and inflammation, like ibuprofen or naproxen -other medicines that prolong the QT interval (cause an abnormal heart rhythm) -procarbazine -rasagiline -supplements like St. John's wort, kava kava, valerian -tramadol -tryptophan This list may not describe all possible interactions. Give your health care provider a list of all the medicines, herbs, non-prescription drugs, or dietary supplements you use. Also tell them if you smoke, drink alcohol, or use illegal drugs. Some items may interact with your medicine. What should I watch for while using this medicine? Tell your doctor if your symptoms do not get better or if they get worse. Visit your doctor or health care professional for regular checks on your progress. Because it may take several weeks to see the full effects of this medicine, it is important to continue your treatment as prescribed by your doctor. Patients and their families should watch out for  new or worsening thoughts of suicide or depression. Also watch out for  sudden changes in feelings such as feeling anxious, agitated, panicky, irritable, hostile, aggressive, impulsive, severely restless, overly excited and hyperactive, or not being able to sleep. If this happens, especially at the beginning of treatment or after a change in dose, call your health care professional. Dennis Bast may get drowsy or dizzy. Do not drive, use machinery, or do anything that needs mental alertness until you know how this medicine affects you. Do not stand or sit up quickly, especially if you are an older patient. This reduces the risk of dizzy or fainting spells. Alcohol may interfere with the effect of this medicine. Avoid alcoholic drinks. Your mouth may get dry. Chewing sugarless gum or sucking hard candy, and drinking plenty of water may help. Contact your doctor if the problem does not go away or is severe. What side effects may I notice from receiving this medicine? Side effects that you should report to your doctor or health care professional as soon as possible: -allergic reactions like skin rash, itching or hives, swelling of the face, lips, or tongue -anxious -black, tarry stools -changes in vision -confusion -elevated mood, decreased need for sleep, racing thoughts, impulsive behavior -eye pain -fast, irregular heartbeat -feeling faint or lightheaded, falls -feeling agitated, angry, or irritable -hallucination, loss of contact with reality -loss of balance or coordination -loss of memory -painful or prolonged erections -restlessness, pacing, inability to keep still -seizures -stiff muscles -suicidal thoughts or other mood changes -trouble sleeping -unusual bleeding or bruising -unusually weak or tired -vomiting Side effects that usually do not require medical attention (report to your doctor or health care professional if they continue or are bothersome): -changes in appetite -change in sex  drive or performance -headache -increased sweating -indigestion, nausea -tremors This list may not describe all possible side effects. Call your doctor for medical advice about side effects. You may report side effects to FDA at 1-800-FDA-1088. Where should I keep my medicine? Keep out of reach of children. Store at room temperature between 15 and 30 degrees C (59 and 86 degrees F). Throw away any unused medicine after the expiration date. NOTE: This sheet is a summary. It may not cover all possible information. If you have questions about this medicine, talk to your doctor, pharmacist, or health care provider.  2019 Elsevier/Gold Standard (2015-12-28 13:20:23)

## 2019-01-09 LAB — CBC WITH DIFFERENTIAL/PLATELET
Basophils Absolute: 0 10*3/uL (ref 0.0–0.2)
Basos: 1 %
EOS (ABSOLUTE): 0.2 10*3/uL (ref 0.0–0.4)
Eos: 3 %
Hematocrit: 36 % (ref 34.0–46.6)
Hemoglobin: 11.9 g/dL (ref 11.1–15.9)
Immature Grans (Abs): 0 10*3/uL (ref 0.0–0.1)
Immature Granulocytes: 0 %
Lymphocytes Absolute: 2.8 10*3/uL (ref 0.7–3.1)
Lymphs: 44 %
MCH: 30 pg (ref 26.6–33.0)
MCHC: 33.1 g/dL (ref 31.5–35.7)
MCV: 91 fL (ref 79–97)
Monocytes Absolute: 0.4 10*3/uL (ref 0.1–0.9)
Monocytes: 7 %
Neutrophils Absolute: 2.7 10*3/uL (ref 1.4–7.0)
Neutrophils: 45 %
Platelets: 290 10*3/uL (ref 150–450)
RBC: 3.97 x10E6/uL (ref 3.77–5.28)
RDW: 12.9 % (ref 11.7–15.4)
WBC: 6.1 10*3/uL (ref 3.4–10.8)

## 2019-01-09 LAB — COMPREHENSIVE METABOLIC PANEL
ALT: 12 IU/L (ref 0–32)
AST: 18 IU/L (ref 0–40)
Albumin/Globulin Ratio: 1.9 (ref 1.2–2.2)
Albumin: 4.6 g/dL (ref 3.8–4.8)
Alkaline Phosphatase: 67 IU/L (ref 39–117)
BUN/Creatinine Ratio: 18 (ref 9–23)
BUN: 13 mg/dL (ref 6–24)
Bilirubin Total: 0.3 mg/dL (ref 0.0–1.2)
CO2: 24 mmol/L (ref 20–29)
Calcium: 9.4 mg/dL (ref 8.7–10.2)
Chloride: 104 mmol/L (ref 96–106)
Creatinine, Ser: 0.74 mg/dL (ref 0.57–1.00)
GFR calc Af Amer: 114 mL/min/{1.73_m2} (ref >59)
GFR calc non Af Amer: 99 mL/min/{1.73_m2} (ref >59)
Globulin, Total: 2.4 g/dL (ref 1.5–4.5)
Glucose: 91 mg/dL (ref 65–99)
Potassium: 4.3 mmol/L (ref 3.5–5.2)
Sodium: 140 mmol/L (ref 134–144)
Total Protein: 7 g/dL (ref 6.0–8.5)

## 2019-01-11 LAB — UA/M W/RFLX CULTURE, ROUTINE
Bilirubin, UA: NEGATIVE
Glucose, UA: NEGATIVE
Ketones, UA: NEGATIVE
Nitrite, UA: NEGATIVE
Protein,UA: NEGATIVE
Specific Gravity, UA: 1.02 (ref 1.005–1.030)
Urobilinogen, Ur: 0.2 mg/dL (ref 0.2–1.0)
pH, UA: 5 (ref 5.0–7.5)

## 2019-01-11 LAB — URINE CULTURE, REFLEX

## 2019-01-11 LAB — MICROSCOPIC EXAMINATION

## 2019-02-15 ENCOUNTER — Encounter: Payer: 59 | Admitting: Nurse Practitioner

## 2019-02-21 ENCOUNTER — Encounter: Payer: Self-pay | Admitting: Radiology

## 2019-04-18 ENCOUNTER — Other Ambulatory Visit: Payer: Self-pay | Admitting: Nurse Practitioner

## 2019-04-18 DIAGNOSIS — Z1231 Encounter for screening mammogram for malignant neoplasm of breast: Secondary | ICD-10-CM

## 2019-08-15 ENCOUNTER — Ambulatory Visit
Admission: RE | Admit: 2019-08-15 | Discharge: 2019-08-15 | Disposition: A | Discharge status: Home / Self Care | Payer: 59 | Source: Ambulatory Visit | Attending: Nurse Practitioner | Admitting: Nurse Practitioner

## 2019-08-15 DIAGNOSIS — Z1231 Encounter for screening mammogram for malignant neoplasm of breast: Secondary | ICD-10-CM | POA: Insufficient documentation

## 2019-10-06 ENCOUNTER — Other Ambulatory Visit: Payer: Self-pay | Admitting: Nurse Practitioner

## 2019-10-06 NOTE — Telephone Encounter (Signed)
Requested medication (s) are due for refill today: yes  Requested medication (s) are on the active medication list: yes  Last refill: 07/08/2019  Future visit scheduled: no  Notes to clinic: no valid encounter within last 6 months   Requested Prescriptions  Pending Prescriptions Disp Refills   escitalopram (LEXAPRO) 20 MG tablet [Pharmacy Med Name: ESCITALOPRAM 20MG TABLETS] 90 tablet 2    Sig: TAKE 1 TABLET(20 MG) BY MOUTH DAILY      Psychiatry:  Antidepressants - SSRI Failed - 10/06/2019 12:25 PM      Failed - Valid encounter within last 6 months    Recent Outpatient Visits           9 months ago Encounter for annual physical exam   [facility], Henrine Screws T, NP   1 year ago Need for influenza vaccination   [facility] Kathrine Haddock, NP   1 year ago [place], NP   2 years ago Annual physical exam   [facility] Kathrine Haddock, NP   2 years ago [place] Kathrine Haddock, NP

## 2019-10-07 NOTE — Telephone Encounter (Signed)
Patient notified. Scheduled a follow up for 01/07/20 at 9:30 am

## 2019-10-07 NOTE — Telephone Encounter (Signed)
LOV 01/08/19

## 2020-01-07 ENCOUNTER — Other Ambulatory Visit: Payer: Self-pay

## 2020-01-07 ENCOUNTER — Encounter: Payer: Self-pay | Admitting: Nurse Practitioner

## 2020-01-07 ENCOUNTER — Telehealth (INDEPENDENT_AMBULATORY_CARE_PROVIDER_SITE_OTHER): Payer: 59 | Admitting: Nurse Practitioner

## 2020-01-07 DIAGNOSIS — F419 Anxiety disorder, unspecified: Secondary | ICD-10-CM

## 2020-01-07 MED ORDER — ESCITALOPRAM OXALATE 20 MG PO TABS: ORAL_TABLET | ORAL | 4 refills | Status: DC

## 2020-01-07 NOTE — Progress Notes (Signed)
BP 114/82    Pulse 76    Temp (!) 96.8 F (36 C) (Temporal)    LMP 12/15/2019 (Exact Date)    Subjective:    Patient ID: Holly Powell, female    DOB: 10-29-1975, 44 y.o.   MRN: [id number]  HPI: Holly Powell is a 44 y.o. female  Chief Complaint  Patient presents with   Anxiety     This visit was completed via MyChart due to the restrictions of the COVID-19 pandemic. All issues as above were discussed and addressed. Physical exam was done as above through visual confirmation on MyChart. If it was felt that the patient should be evaluated in the office, they were directed there. The patient verbally consented to this visit.  Location of the patient: home  Location of the provider: work  Those involved with this call:   Provider: Marnee Guarneri, DNP  CMA: Yvonna Alanis, [place] Desk/Registration: Don Perking   Time spent on call: 15 minutes with patient face to face via video conference. More than 50% of this time was spent in counseling and coordination of care. 10 minutes total spent in review of patient's record and preparation of their chart.   I verified patient identity using two factors (patient name and date of birth). Patient consents verbally to being seen via telemedicine visit today.    ANXIETY/STRESS Continues on Lexapro 20 MG daily.  She reports hopes to come off of eventually.   Duration:controlled Anxious mood: no  Excessive worrying: no Irritability: no  Sweating: no Nausea: no Palpitations:no Hyperventilation: no Panic attacks: no Agoraphobia: no  Obscessions/compulsions: no Depressed mood: no Depression screen [facility] 2/9 01/07/2020 01/08/2019 11/10/2017 09/25/2017 08/11/2017  Decreased Interest 0 0 0 0 0  Down, Depressed, Hopeless 0 0 0 0 0  PHQ - 2 Score 0 0 0 0 0  Altered sleeping 0 0 0 1 0  Tired, decreased energy 0 0 0 0 0  Change in appetite 3 0 2 1 2   Feeling bad or failure about yourself  0 0 0 0 0  Trouble concentrating 0 0 1 0 1   Moving slowly or fidgety/restless 0 0 0 0 0  Suicidal thoughts 0 0 0 0 0  PHQ-9 Score 3 0 3 2 3   Difficult doing work/chores Not difficult at all Not difficult at all - - -   Anhedonia: no Weight changes: no Insomnia: none Hypersomnia: no Fatigue/loss of energy: no Feelings of worthlessness: no Feelings of guilt: no Impaired concentration/indecisiveness: no Suicidal ideations: no  Crying spells: no Recent Stressors/Life Changes: no   Relationship problems: yes   Family stress: no     Financial stress: no    Job stress: no    Recent death/loss: no GAD 7 : Generalized Anxiety Score 01/07/2020 01/08/2019 05/11/2018 01/11/2017  Nervous, Anxious, on Edge 1 0 3 3  Control/stop worrying 0 0 0 3  Worry too much - different things 0 0 0 3  Trouble relaxing 3 1 0 3  Restless 2 1 0 3  Easily annoyed or irritable 2 1 1  0  Afraid - awful might happen 0 0 0 3  Total GAD 7 Score 8 3 4 18   Anxiety Difficulty Not difficult at all Not difficult at all - Extremely difficult    Relevant past medical, surgical, family and social history reviewed and updated as indicated. Interim medical history since our last visit reviewed. Allergies and medications reviewed and updated.  Review of Systems  Constitutional: Negative for activity change, appetite change, diaphoresis, fatigue and fever.  Respiratory: Negative for cough, chest tightness, shortness of breath and wheezing.   Cardiovascular: Negative for chest pain, palpitations and leg swelling.  Gastrointestinal: Negative.   Neurological: Negative.   Psychiatric/Behavioral: Negative for decreased concentration, self-injury, sleep disturbance and suicidal ideas. The patient is not nervous/anxious.     Per HPI unless specifically indicated above     Objective:    BP 114/82    Pulse 76    Temp (!) 96.8 F (36 C) (Temporal)    LMP 12/15/2019 (Exact Date)   Wt Readings from Last 3 Encounters:  01/08/19 180 lb (81.6 kg)  05/11/18 175 lb 1.6 oz  (79.4 kg)  04/10/18 173 lb 3.2 oz (78.6 kg)    Physical Exam Vitals and nursing note reviewed.  Constitutional:      General: She is awake. She is not in acute distress.    Appearance: She is well-developed. She is not ill-appearing.  HENT:     Head: Normocephalic.     Right Ear: Hearing normal.     Left Ear: Hearing normal.  Eyes:     General: Lids are normal.        Right eye: No discharge.        Left eye: No discharge.     Conjunctiva/sclera: Conjunctivae normal.  Pulmonary:     Effort: Pulmonary effort is normal. No accessory muscle usage or respiratory distress.  Musculoskeletal:     Cervical back: Normal range of motion.  Neurological:     Mental Status: She is alert and oriented to person, place, and time.  Psychiatric:        Attention and Perception: Attention normal.        Mood and Affect: Mood normal.        Behavior: Behavior normal. Behavior is cooperative.        Thought Content: Thought content normal.        Judgment: Judgment normal.    Results for orders placed or performed in visit on 01/08/19  Microscopic Examination   URINE  Result Value Ref Range   WBC, UA 0-5 0 - 5 /hpf   RBC 0-2 0 - 2 /hpf   Epithelial Cells (non renal) 0-10 0 - 10 /hpf   Bacteria, UA Moderate (A) None seen/Few  Urine Culture, Reflex   URINE  Result Value Ref Range   Urine Culture, Routine Final report    Organism ID, Bacteria Lactobacillus species   CBC with Differential/Platelet  Result Value Ref Range   WBC 6.1 3.4 - 10.8 x10E3/uL   RBC 3.97 3.77 - 5.28 x10E6/uL   Hemoglobin 11.9 11.1 - 15.9 g/dL   Hematocrit 36.0 34.0 - 46.6 %   MCV 91 79 - 97 fL   MCH 30.0 26.6 - 33.0 pg   MCHC 33.1 31.5 - 35.7 g/dL   RDW 12.9 11.7 - 15.4 %   Platelets 290 150 - 450 x10E3/uL   Neutrophils 45 Not Estab. %   Lymphs 44 Not Estab. %   Monocytes 7 Not Estab. %   Eos 3 Not Estab. %   Basos 1 Not Estab. %   Neutrophils Absolute 2.7 1.4 - 7.0 x10E3/uL   Lymphocytes Absolute 2.8 0.7 -  3.1 x10E3/uL   Monocytes Absolute 0.4 0.1 - 0.9 x10E3/uL   EOS (ABSOLUTE) 0.2 0.0 - 0.4 x10E3/uL   Basophils Absolute 0.0 0.0 - 0.2 x10E3/uL   Immature Granulocytes 0 Not Estab. %  Immature Grans (Abs) 0.0 0.0 - 0.1 x10E3/uL  Comprehensive metabolic panel  Result Value Ref Range   Glucose 91 65 - 99 mg/dL   BUN 13 6 - 24 mg/dL   Creatinine, Ser 0.74 0.57 - 1.00 mg/dL   GFR calc non Af Amer 99 >59 mL/min/1.73   GFR calc Af Amer 114 >59 mL/min/1.73   BUN/Creatinine Ratio 18 9 - 23   Sodium 140 134 - 144 mmol/L   Potassium 4.3 3.5 - 5.2 mmol/L   Chloride 104 96 - 106 mmol/L   CO2 24 20 - 29 mmol/L   Calcium 9.4 8.7 - 10.2 mg/dL   Total Protein 7.0 6.0 - 8.5 g/dL   Albumin 4.6 3.8 - 4.8 g/dL   Globulin, Total 2.4 1.5 - 4.5 g/dL   Albumin/Globulin Ratio 1.9 1.2 - 2.2   Bilirubin Total 0.3 0.0 - 1.2 mg/dL   Alkaline Phosphatase 67 39 - 117 IU/L   AST 18 0 - 40 IU/L   ALT 12 0 - 32 IU/L  UA/M w/rflx Culture, Routine   Specimen: Urine   URINE  Result Value Ref Range   Specific Gravity, UA 1.020 1.005 - 1.030   pH, UA 5.0 5.0 - 7.5   Color, UA Yellow Yellow   Appearance Ur Clear Clear   Leukocytes,UA 2+ (A) Negative   Protein,UA Negative Negative/Trace   Glucose, UA Negative Negative   Ketones, UA Negative Negative   RBC, UA 1+ (A) Negative   Bilirubin, UA Negative Negative   Urobilinogen, Ur 0.2 0.2 - 1.0 mg/dL   Nitrite, UA Negative Negative   Microscopic Examination See below:    Urinalysis Reflex Comment       Assessment & Plan:   Problem List Items Addressed This Visit      Other   Anxiety    Chronic, stable.  Continues to have good control on Lexapro, will continue this regimen and send in refills.  Denies SI/HI.  Return in 6 months for annual physical.      Relevant Medications   escitalopram (LEXAPRO) 20 MG tablet      I discussed the assessment and treatment plan with the patient. The patient was provided an opportunity to ask questions and all were  answered. The patient agreed with the plan and demonstrated an understanding of the instructions.   The patient was advised to call back or seek an in-person evaluation if the symptoms worsen or if the condition fails to improve as anticipated.   I provided 15+ minutes of time during this encounter.  Follow up plan: Return in about 6 months (around 07/08/2020) for Annual physical.

## 2020-01-07 NOTE — Assessment & Plan Note (Signed)
Chronic, stable.  Continues to have good control on Lexapro, will continue this regimen and send in refills.  Denies SI/HI.  Return in 6 months for annual physical.

## 2020-01-07 NOTE — Patient Instructions (Signed)

## 2020-10-19 ENCOUNTER — Other Ambulatory Visit: Payer: Self-pay | Admitting: Obstetrics & Gynecology

## 2020-10-19 DIAGNOSIS — Z1231 Encounter for screening mammogram for malignant neoplasm of breast: Secondary | ICD-10-CM

## 2020-10-20 ENCOUNTER — Ambulatory Visit: Payer: 59 | Admitting: Dermatology

## 2020-12-24 ENCOUNTER — Ambulatory Visit
Admission: RE | Admit: 2020-12-24 | Discharge: 2020-12-24 | Disposition: A | Discharge status: Home / Self Care | Payer: 59 | Source: Ambulatory Visit | Attending: Obstetrics & Gynecology | Admitting: Obstetrics & Gynecology

## 2020-12-24 ENCOUNTER — Other Ambulatory Visit: Payer: Self-pay

## 2020-12-24 DIAGNOSIS — Z1231 Encounter for screening mammogram for malignant neoplasm of breast: Secondary | ICD-10-CM | POA: Diagnosis present

## 2021-04-12 ENCOUNTER — Telehealth: Payer: No Typology Code available for payment source | Admitting: Physician Assistant

## 2021-04-12 DIAGNOSIS — L255 Unspecified contact dermatitis due to plants, except food: Secondary | ICD-10-CM

## 2021-04-12 MED ORDER — CLOBETASOL PROPIONATE 0.05 % EX OINT: 1.0000 "application " | TOPICAL_OINTMENT | Freq: Two times a day (BID) | CUTANEOUS | 0 refills | Status: DC

## 2021-04-12 NOTE — Progress Notes (Signed)
Virtual Visit Consent   Holly Powell, you are scheduled for a virtual visit with a [facility] provider today.     Just as with appointments in the office, your consent must be obtained to participate.  Your consent will be active for this visit and any virtual visit you may have with one of our providers in the next 365 days.     If you have a MyChart account, a copy of this consent can be sent to you electronically.  All virtual visits are billed to your insurance company just like a traditional visit in the office.    As this is a virtual visit, video technology does not allow for your provider to perform a traditional examination.  This may limit your provider's ability to fully assess your condition.  If your provider identifies any concerns that need to be evaluated in person or the need to arrange testing (such as labs, EKG, etc.), we will make arrangements to do so.     Although advances in technology are sophisticated, we cannot ensure that it will always work on either your end or our end.  If the connection with a video visit is poor, the visit may have to be switched to a telephone visit.  With either a video or telephone visit, we are not always able to ensure that we have a secure connection.     I need to obtain your verbal consent now.   Are you willing to proceed with your visit today?    Holly Powell has provided verbal consent on 04/12/2021 for a virtual visit (video or telephone).   Mar Daring, PA-C   Date: 04/12/2021 2:19 PM   Virtual Visit via Video Note   I, Mar Daring, connected with  Holly Powell  ([id number], June 03, 1976) on 04/12/21 at  2:15 PM EDT by a video-enabled telemedicine application and verified that I am speaking with the correct person using two identifiers.  Location: Patient: Virtual Visit Location Patient: Home Provider: Virtual Visit Location Provider: Home Office   I discussed the limitations of evaluation and management by  telemedicine and the availability of in person appointments. The patient expressed understanding and agreed to proceed.    History of Present Illness: Holly Powell is a 45 y.o. who identifies as a female who was assigned female at birth, and is being seen today for possible poison oak/ivy dermatitis.  HPI: Rash This is a new problem. The current episode started in the past 7 days. The problem has been gradually worsening since onset. The rash is characterized by blistering, itchiness and redness. She was exposed to plant contact. Pertinent negatives include no congestion, cough, facial edema, fever, shortness of breath or sore throat. Past treatments include anti-itch cream and topical steroids. The treatment provided mild relief.     Problems:  Patient Active Problem List   Diagnosis Date Noted   GERD (gastroesophageal reflux disease) 08/11/2017   Anxiety 03/31/2017   Onychomycosis of right great toe 03/31/2017   Neurocardiogenic pre-syncope 01/06/2017    Allergies:  Allergies  Allergen Reactions   Penicillins Rash   Medications:  Current Outpatient Medications:    clobetasol ointment (TEMOVATE) 5.03 %, Apply 1 application topically 2 (two) times daily., Disp: 30 g, Rfl: 0   escitalopram (LEXAPRO) 20 MG tablet, TAKE 1 TABLET(20 MG) BY MOUTH DAILY., Disp: 90 tablet, Rfl: 4   Methylcobalamin (B12-ACTIVE PO), Take 2 tablets by mouth daily. Active B12 with L-5-MTHF, Disp: ,  Rfl:    PREVIDENT 5000 BOOSTER PLUS 1.1 % PSTE, daily. , Disp: , Rfl:    UNABLE TO FIND, Arabinex powder, Disp: , Rfl:    UNABLE TO FIND, 10 drops daily. CT- minerals, Disp: , Rfl:    UNABLE TO FIND, Take 6.25 mg by mouth daily. Fulvic Iodine, Disp: , Rfl:    UNABLE TO FIND, Take 1,040 mg by mouth daily. GI- Detox, Disp: , Rfl:    Vitamin D-Vitamin K (D3 + K2 DOTS PO), Take 5 drops by mouth daily., Disp: , Rfl:   Observations/Objective: Patient is well-developed, well-nourished in no acute distress.  Resting  comfortably at home.  Head is normocephalic, atraumatic.  No labored breathing.  Speech is clear and coherent with logical content.  Patient is alert and oriented at baseline.    Assessment and Plan: 1. Dermatitis due to plants, including poison ivy, sumac, and oak - clobetasol ointment (TEMOVATE) 0.05 %; Apply 1 application topically 2 (two) times daily.  Dispense: 30 g; Refill: 0  - Has used clobetasol successfully in the past; sent in new Rx - Advised can use benadryl cream as well - Cool showers - Avoid scratching/scrubbing area - Seek in person evaluation or reach back out to the virtual platform if continues to spread or worsen  Follow Up Instructions: I discussed the assessment and treatment plan with the patient. The patient was provided an opportunity to ask questions and all were answered. The patient agreed with the plan and demonstrated an understanding of the instructions.  A copy of instructions were sent to the patient via MyChart.  The patient was advised to call back or seek an in-person evaluation if the symptoms worsen or if the condition fails to improve as anticipated.  Time:  I spent 10 minutes with the patient via telehealth technology discussing the above problems/concerns.    Mar Daring, PA-C

## 2021-04-12 NOTE — Patient Instructions (Signed)
Holly Powell, thank you for joining Mar Daring, PA-C for today's virtual visit.  While this provider is not your primary care provider (PCP), if your PCP is located in our provider database this encounter information will be shared with them immediately following your visit.  Consent: (Patient) Holly Powell provided verbal consent for this virtual visit at the beginning of the encounter.  Current Medications:  Current Outpatient Medications:    clobetasol ointment (TEMOVATE) 7.02 %, Apply 1 application topically 2 (two) times daily., Disp: 30 g, Rfl: 0   escitalopram (LEXAPRO) 20 MG tablet, TAKE 1 TABLET(20 MG) BY MOUTH DAILY., Disp: 90 tablet, Rfl: 4   Methylcobalamin (B12-ACTIVE PO), Take 2 tablets by mouth daily. Active B12 with L-5-MTHF, Disp: , Rfl:    PREVIDENT 5000 BOOSTER PLUS 1.1 % PSTE, daily. , Disp: , Rfl:    UNABLE TO FIND, Arabinex powder, Disp: , Rfl:    UNABLE TO FIND, 10 drops daily. CT- minerals, Disp: , Rfl:    UNABLE TO FIND, Take 6.25 mg by mouth daily. Fulvic Iodine, Disp: , Rfl:    UNABLE TO FIND, Take 1,040 mg by mouth daily. GI- Detox, Disp: , Rfl:    Vitamin D-Vitamin K (D3 + K2 DOTS PO), Take 5 drops by mouth daily., Disp: , Rfl:    Medications ordered in this encounter:  Meds ordered this encounter  Medications   clobetasol ointment (TEMOVATE) 0.05 %    Sig: Apply 1 application topically 2 (two) times daily.    Dispense:  30 g    Refill:  0    Order Specific Question:   Supervising Provider    Answer:   Sabra Heck, [place]     *If you need refills on other medications prior to your next appointment, please contact your pharmacy*  Follow-Up: Call back or seek an in-person evaluation if the symptoms worsen or if the condition fails to improve as anticipated.  Other Instructions Poison Oak Dermatitis Poison oak dermatitis is inflammation of the skin that is caused by contact with the chemicals in the leaves of the poison oak (Toxicodendron)  plant. The skin reaction often includes redness, swelling, blisters, and extreme itching. What are the causes? This condition is caused by a specific chemical (urushiol) that is found in the sap of the poison oak plant. This chemical is sticky and can be easily spread to people, animals, and objects. You can get poison oak dermatitis by: Having direct contact with a poison oak plant. Touching animals, other people, or objects that have come in contact with poison oak and have the chemical on them. What increases the risk? This condition is more likely to develop in people who: Are outdoors often in wooded or [place] areas. Go outdoors without wearing protective clothing, such as closed shoes, long pants, and a long-sleeved shirt. What are the signs or symptoms? Symptoms of this condition include: Redness of the skin. Extreme itching. A rash that often includes bumps and blisters. The rash usually appears 48 hours after exposure if you have been exposed before. If this is the first time you have been exposed, the rash may not appear until a week after exposure. Swelling. This may occur if the reaction is more severe. Symptoms usually last for 1-2 weeks. However, the first time you develop this condition, symptoms may last 3-4 weeks. How is this diagnosed? This condition may be diagnosed based on your symptoms and a physical exam. Your health care provider may also ask you  about any recent outdoor activity. How is this treated? Treatment for this condition will vary depending on how severe it is. Treatment may include: Hydrocortisone creams or calamine lotions to relieve itching. Oatmeal baths to soothe the skin. Over-the-counter antihistamine medicines to help reduce itching. Steroid medicine taken by mouth (orally) for more severe reactions. Follow these instructions at home: Medicines Take or apply over-the-counter and prescription medicines only as told by your health care provider. Use  hydrocortisone creams or calamine lotion as needed to soothe the skin and relieve itching. General instructions Do not scratch or rub your skin. Apply a cold, wet cloth (cold compress) to the affected areas or take baths in cool water. This will help with itching. Avoid hot baths and showers. Take oatmeal baths as needed. Use colloidal oatmeal. You can get this at your local pharmacy or grocery store. Follow the instructions on the packaging. While you have the rash, wash clothes right after you wear them. Keep all follow-up visits as told by your health care provider. This is important. How is this prevented?  Learn to identify the poison oak plant and avoid contact with the plant. This plant can be recognized by the number of leaves. Generally, poison oak has three leaves with flowering branches on a single stem. The leaves are often a bit fuzzy and have a toothlike edge. If you have been exposed to poison oak, thoroughly wash your skin with soap and water right away. You have about 30 minutes to remove the plant resin before it will cause the rash. Be sure to wash under your fingernails because any plant resin there will continue to spread the rash. When hiking or camping, wear clothes that will help you avoid exposure on the skin. This includes long pants, a long-sleeved shirt, tall socks, and hiking boots. You can also apply preventive lotion to your skin to help limit exposure. If you suspect that your clothes or outdoor gear came in contact with poison oak, rinse them off outside with a garden hose before bringing them inside your house. When doing yard work or gardening, wear gloves, long sleeves, long pants, and boots. Wash your garden tools and gloves if they come in contact with poison oak. If you suspect that your pet has come into contact with poison oak, wash him or her with pet shampoo and water. Make sure you wear gloves while washing your pet. Do not burn poison oak plants. This can  release the chemical from the plant into the air and may cause a reaction on the skin or eyes, or in the lungs from breathing in the smoke. Contact a health care provider if you have: Open sores in the rash area. More redness, swelling, or pain in the affected area. Redness that spreads beyond the rash area. Fluid, blood, or pus coming from the affected area. A fever. A rash over a large area of your body. A rash on your eyes, mouth, or genitals. A rash that does not improve after a few weeks. Get help right away if: Your face swells or your eyes swell shut. You have trouble breathing. You have trouble swallowing. These symptoms may represent a serious problem that is an emergency. Do not wait to see if the symptoms will go away. Get medical help right away. Call your local emergency services (911 in the [place] Do not drive yourself to the hospital. Summary Poison oak dermatitis is inflammation of the skin that is caused by contact with the chemicals on the  leaves of the poison oak (Toxicodendron) plant. Symptoms of this condition include redness, extreme itching, a rash, and swelling. Do not scratch or rub your skin. Take or apply over-the-counter and prescription medicines only as told by your health care provider. This information is not intended to replace advice given to you by your health care provider. Make sure you discuss any questions you have with your health care provider. Document Revised: 11/16/2018 Document Reviewed: 08/24/2018 Elsevier Patient Education  2022 [employer].    If you have been instructed to have an in-person evaluation today at a local Urgent Care facility, please use the link below. It will take you to a list of all of our available [facility], including address, phone number and hours of operation. Please do not delay care.  [facility]  If you or a family member do not have a primary care provider, use the link below to  schedule a visit and establish care. When you choose a [facility] primary care physician or advanced practice provider, you gain a long-term partner in health. Find a Primary Care Provider  Learn more about [facility] in-office and virtual care options: [facility]

## 2021-10-02 ENCOUNTER — Encounter: Payer: Self-pay | Admitting: Nurse Practitioner

## 2021-10-04 ENCOUNTER — Other Ambulatory Visit: Payer: Self-pay

## 2021-10-04 MED ORDER — ESCITALOPRAM OXALATE 20 MG PO TABS
ORAL_TABLET | ORAL | 4 refills | Status: DC
  Filled 2021-10-04 – 2021-10-06 (×3): qty 90, 90d supply, fill #0
  Filled 2022-02-11: qty 90, 90d supply, fill #1
  Filled 2022-06-22: qty 90, 90d supply, fill #2
  Filled 2022-09-09: qty 90, 90d supply, fill #3

## 2021-10-04 MED ORDER — ESCITALOPRAM OXALATE 20 MG PO TABS: ORAL_TABLET | ORAL | 4 refills | Status: DC

## 2021-10-04 NOTE — Addendum Note (Signed)
Addended by: Marnee Guarneri T on: 10/04/2021 12:22 PM   Modules accepted: Orders

## 2021-10-06 ENCOUNTER — Other Ambulatory Visit: Payer: Self-pay

## 2021-10-10 NOTE — Patient Instructions (Signed)
Managing Anxiety, Adult ?After being diagnosed with anxiety, you may be relieved to know why you have felt or behaved a certain way. You may also feel overwhelmed about the treatment ahead and what it will mean for your life. With care and support, you can manage this condition. ?How to manage lifestyle changes ?Managing stress and anxiety ?Stress is your body's reaction to life changes and events, both good and bad. Most stress will last just a few hours, but stress can be ongoing and can lead to more than just stress. Although stress can play a major role in anxiety, it is not the same as anxiety. Stress is usually caused by something external, such as a deadline, test, or competition. Stress normally passes after the triggering event has ended.  ?Anxiety is caused by something internal, such as imagining a terrible outcome or worrying that something will go wrong that will devastate you. Anxiety often does not go away even after the triggering event is over, and it can become long-term (chronic) worry. It is important to understand the differences between stress and anxiety and to manage your stress effectively so that it does not lead to an anxious response. ?Talk with your health care provider or a counselor to learn more about reducing anxiety and stress. He or she may suggest tension reduction techniques, such as: ?Music therapy. Spend time creating or listening to music that you enjoy and that inspires you. ?Mindfulness-based meditation. Practice being aware of your normal breaths while not trying to control your breathing. It can be done while sitting or walking. ?Centering prayer. This involves focusing on a word, phrase, or sacred image that means something to you and brings you peace. ?Deep breathing. To do this, expand your stomach and inhale slowly through your nose. Hold your breath for 3-5 seconds. Then exhale slowly, letting your stomach muscles relax. ?Self-talk. Learn to notice and identify  thought patterns that lead to anxiety reactions and change those patterns to thoughts that feel peaceful. ?Muscle relaxation. Taking time to tense muscles and then relax them. ?Choose a tension reduction technique that fits your lifestyle and personality. These techniques take time and practice. Set aside 5-15 minutes a day to do them. Therapists can offer counseling and training in these techniques. The training to help with anxiety may be covered by some insurance plans. ?Other things you can do to manage stress and anxiety include: ?Keeping a stress diary. This can help you learn what triggers your reaction and then learn ways to manage your response. ?Thinking about how you react to certain situations. You may not be able to control everything, but you can control your response. ?Making time for activities that help you relax and not feeling guilty about spending your time in this way. ?Doing visual imagery. This involves imagining or creating mental pictures to help you relax. ?Practicing yoga. Through yoga poses, you can lower tension and promote relaxation. ? ?Medicines ?Medicines can help ease symptoms. Medicines for anxiety include: ?Antidepressant medicines. These are usually prescribed for long-term daily control. ?Anti-anxiety medicines. These may be added in severe cases, especially when panic attacks occur. ?Medicines will be prescribed by a health care provider. When used together, medicines, psychotherapy, and tension reduction techniques may be the most effective treatment. ?Relationships ?Relationships can play a big part in helping you recover. Try to spend more time connecting with trusted friends and family members. ?Consider going to couples counseling if you have a partner, taking family education classes, or going to family  therapy. ?Therapy can help you and others better understand your condition. ?How to recognize changes in your anxiety ?Everyone responds differently to treatment for  anxiety. Recovery from anxiety happens when symptoms decrease and stop interfering with your daily activities at home or work. This may mean that you will start to: ?Have better concentration and focus. Worry will interfere less in your daily thinking. ?Sleep better. ?Be less irritable. ?Have more energy. ?Have improved memory. ?It is also important to recognize when your condition is getting worse. Contact your health care provider if your symptoms interfere with home or work and you feel like your condition is not improving. ?Follow these instructions at home: ?Activity ?Exercise. Adults should do the following: ?Exercise for at least 150 minutes each week. The exercise should increase your heart rate and make you sweat (moderate-intensity exercise). ?Strengthening exercises at least twice a week. ?Get the right amount and quality of sleep. Most adults need 7-9 hours of sleep each night. ?Lifestyle ? ?Eat a healthy diet that includes plenty of vegetables, fruits, whole grains, low-fat dairy products, and lean protein. ?Do not eat a lot of foods that are high in fats, added sugars, or salt (sodium). ?Make choices that simplify your life. ?Do not use any products that contain nicotine or tobacco. These products include cigarettes, chewing tobacco, and vaping devices, such as e-cigarettes. If you need help quitting, ask your health care provider. ?Avoid caffeine, alcohol, and certain over-the-counter cold medicines. These may make you feel worse. Ask your pharmacist which medicines to avoid. ?General instructions ?Take over-the-counter and prescription medicines only as told by your health care provider. ?Keep all follow-up visits. This is important. ?Where to find support ?You can get help and support from these sources: ?Self-help groups. ?Online and [employer]. ?A trusted spiritual leader. ?Couples counseling. ?Family education classes. ?Family therapy. ?Where to find more information ?You may find  that joining a support group helps you deal with your anxiety. The following sources can help you locate counselors or support groups near you: ?[place]: www.mentalhealthamerica.net ?Anxiety and Depression Association of America (ADAA): https://www.clark.net/ ?National Alliance on Mental Illness (NAMI): www.nami.org ?Contact a health care provider if: ?You have a hard time staying focused or finishing daily tasks. ?You spend many hours a day feeling worried about everyday life. ?You become exhausted by worry. ?You start to have headaches or frequently feel tense. ?You develop chronic nausea or diarrhea. ?Get help right away if: ?You have a racing heart and shortness of breath. ?You have thoughts of hurting yourself or others. ?If you ever feel like you may hurt yourself or others, or have thoughts about taking your own life, get help right away. Go to your nearest emergency department or: ?Call your local emergency services (911 in the [place] ?Call a suicide crisis helpline, such as the [place] at (971) 347-8651 or 988 in the [place]. This is open 24 hours a day in the [place] ?Text the Crisis Text Line at 657-054-6995 (in the [place] ?Summary ?Taking steps to learn and use tension reduction techniques can help calm you and help prevent triggering an anxiety reaction. ?When used together, medicines, psychotherapy, and tension reduction techniques may be the most effective treatment. ?Family, friends, and partners can play a big part in supporting you. ?This information is not intended to replace advice given to you by your health care provider. Make sure you discuss any questions you have with your health care provider. ?Document Revised: 02/17/2021 Document Reviewed: 11/15/2020 ?Elsevier Patient  Education ? [place]. ? ?

## 2021-10-15 ENCOUNTER — Encounter: Payer: Self-pay | Admitting: Nurse Practitioner

## 2021-10-15 ENCOUNTER — Other Ambulatory Visit: Payer: Self-pay

## 2021-10-15 ENCOUNTER — Ambulatory Visit (INDEPENDENT_AMBULATORY_CARE_PROVIDER_SITE_OTHER): Payer: No Typology Code available for payment source | Admitting: Nurse Practitioner

## 2021-10-15 VITALS — BP 109/78 | HR 81 | Temp 98.4°F | Ht 64.0 in | Wt 191.6 lb

## 2021-10-15 DIAGNOSIS — Z862 Personal history of diseases of the blood and blood-forming organs and certain disorders involving the immune mechanism: Secondary | ICD-10-CM | POA: Diagnosis not present

## 2021-10-15 DIAGNOSIS — Z13220 Encounter for screening for lipoid disorders: Secondary | ICD-10-CM | POA: Diagnosis not present

## 2021-10-15 DIAGNOSIS — K219 Gastro-esophageal reflux disease without esophagitis: Secondary | ICD-10-CM

## 2021-10-15 DIAGNOSIS — Z1159 Encounter for screening for other viral diseases: Secondary | ICD-10-CM

## 2021-10-15 DIAGNOSIS — Z0000 Encounter for general adult medical examination without abnormal findings: Secondary | ICD-10-CM

## 2021-10-15 DIAGNOSIS — E559 Vitamin D deficiency, unspecified: Secondary | ICD-10-CM

## 2021-10-15 DIAGNOSIS — Z1211 Encounter for screening for malignant neoplasm of colon: Secondary | ICD-10-CM

## 2021-10-15 DIAGNOSIS — F419 Anxiety disorder, unspecified: Secondary | ICD-10-CM | POA: Diagnosis not present

## 2021-10-15 DIAGNOSIS — Z136 Encounter for screening for cardiovascular disorders: Secondary | ICD-10-CM

## 2021-10-15 NOTE — Assessment & Plan Note (Signed)
Chronic, ongoing.  Denies SI/HI.  Continue current medication regimen and adjust as needed.  Labs today. ?

## 2021-10-15 NOTE — Progress Notes (Signed)
BP 109/78   Pulse 81   Temp 98.4 F (36.9 C) (Oral)   Ht 5\' 4"  (1.626 m)   Wt 191 lb 9.6 oz (86.9 kg)   SpO2 100%   BMI 32.89 kg/m    Subjective:    Patient ID: Holly Powell, female    DOB: 1976-06-24, 46 y.o.   MRN: [id number]  HPI: Holly Powell is a 46 y.o. female presenting on 10/15/2021 for comprehensive medical examination. Current medical complaints include:none  She currently lives with: significant other Menopausal Symptoms: no  ANXIETY Continues on Lexapro daily.  She is frustrated with not losing weight, endorses this is her struggle as has not been trying. Mood status: stable Satisfied with current treatment?: yes Symptom severity: mild  Duration of current treatment : chronic Side effects: no Medication compliance: good compliance Psychotherapy/counseling: yes in the past Previous psychiatric medications: Lexapro Depressed mood: no Anxious mood: no Anhedonia: no Significant weight loss or gain: no Insomnia: none Fatigue: yes Feelings of worthlessness or guilt: no Impaired concentration/indecisiveness: no Suicidal ideations: no Hopelessness: no Crying spells: no Depression screen [facility] 2/9 10/15/2021 01/07/2020 01/08/2019 11/10/2017 09/25/2017  Decreased Interest 0 0 0 0 0  Down, Depressed, Hopeless 0 0 0 0 0  PHQ - 2 Score 0 0 0 0 0  Altered sleeping 0 0 0 0 1  Tired, decreased energy 2 0 0 0 0  Change in appetite 3 3 0 2 1  Feeling bad or failure about yourself  0 0 0 0 0  Trouble concentrating 0 0 0 1 0  Moving slowly or fidgety/restless 0 0 0 0 0  Suicidal thoughts 0 0 0 0 0  PHQ-9 Score 5 3 0 3 2  Difficult doing work/chores - Not difficult at all Not difficult at all - -    The patient does not have a history of falls. I did not complete a risk assessment for falls. A plan of care for falls was not documented.   Past Medical History:  Past Medical History:  Diagnosis Date   GERD (gastroesophageal reflux disease)    Pyelonephritis 2002    history of in2002 pregnancy   Type O blood, Rh negative     Surgical History:  Past Surgical History:  Procedure Laterality Date   AUGMENTATION MAMMAPLASTY Bilateral 2011   saline   BREAST ENHANCEMENT SURGERY  2011    Medications:  Current Outpatient Medications on File Prior to Visit  Medication Sig   clobetasol ointment (TEMOVATE) 0.05 % Apply 1 application topically 2 (two) times daily.   escitalopram (LEXAPRO) 20 MG tablet TAKE 1 TABLET (20 MG) BY MOUTH DAILY.   PREVIDENT 5000 BOOSTER PLUS 1.1 % PSTE daily.    UNABLE TO FIND Arabinex powder   UNABLE TO FIND 10 drops daily. CT- minerals   UNABLE TO FIND Take 6.25 mg by mouth daily. Fulvic Iodine   UNABLE TO FIND Take 1,040 mg by mouth daily. GI- Detox   Vitamin D-Vitamin K (D3 + K2 DOTS PO) Take 5 drops by mouth daily.   No current facility-administered medications on file prior to visit.    Allergies:  Allergies  Allergen Reactions   Penicillins Rash   Social History:  Social History   Socioeconomic History   Marital status: Married    Spouse name: Not on file   Number of children: Not on file   Years of education: Not on file   Highest education level: Not on file  Occupational History  Not on file  Tobacco Use   Smoking status: Never   Smokeless tobacco: Never  Vaping Use   Vaping Use: Never used  Substance and Sexual Activity   Alcohol use: No   Drug use: No   Sexual activity: Yes    Birth control/protection: None  Other Topics Concern   Not on file  Social History Narrative   Not on file   Social Determinants of Health   Financial Resource Strain: Not on file  Food Insecurity: Not on file  Transportation Needs: Not on file  Physical Activity: Not on file  Stress: Not on file  Social Connections: Not on file  Intimate Partner Violence: Not on file   Social History   Tobacco Use  Smoking Status Never  Smokeless Tobacco Never   Social History   Substance and Sexual Activity  Alcohol  Use No    Family History:  Family History  Problem Relation Age of Onset   Diabetes Mother    Hypertension Mother    Pancreatitis Mother    Heart disease Maternal Grandfather    Diabetes Son        type 1   Cancer Neg Hx    Stroke Neg Hx    COPD Neg Hx    Breast cancer Neg Hx     Past medical history, surgical history, medications, allergies, family history and social history reviewed with patient today and changes made to appropriate areas of the chart.   ROS All other ROS negative except what is listed above and in the HPI.      Objective:    BP 109/78   Pulse 81   Temp 98.4 F (36.9 C) (Oral)   Ht 5\' 4"  (1.626 m)   Wt 191 lb 9.6 oz (86.9 kg)   SpO2 100%   BMI 32.89 kg/m   Wt Readings from Last 3 Encounters:  10/15/21 191 lb 9.6 oz (86.9 kg)  01/08/19 180 lb (81.6 kg)  05/11/18 175 lb 1.6 oz (79.4 kg)    Physical Exam Vitals and nursing note reviewed.  Constitutional:      General: She is awake. She is not in acute distress.    Appearance: She is well-developed. She is not ill-appearing.  HENT:     Head: Normocephalic and atraumatic.     Right Ear: Hearing, tympanic membrane, ear canal and external ear normal. No drainage.     Left Ear: Hearing, tympanic membrane, ear canal and external ear normal. No drainage.     Nose: Nose normal.     Right Sinus: No maxillary sinus tenderness or frontal sinus tenderness.     Left Sinus: No maxillary sinus tenderness or frontal sinus tenderness.     Mouth/Throat:     Mouth: Mucous membranes are moist.     Pharynx: Oropharynx is clear. Uvula midline. No pharyngeal swelling, oropharyngeal exudate or posterior oropharyngeal erythema.  Eyes:     General: Lids are normal.        Right eye: No discharge.        Left eye: No discharge.     Extraocular Movements: Extraocular movements intact.     Conjunctiva/sclera: Conjunctivae normal.     Pupils: Pupils are equal, round, and reactive to light.     Visual Fields: Right eye  visual fields normal and left eye visual fields normal.  Neck:     Thyroid: No thyromegaly.     Vascular: No carotid bruit.     Trachea: Trachea normal.  Cardiovascular:     Rate and Rhythm: Normal rate and regular rhythm.     Heart sounds: Normal heart sounds. No murmur heard.   No gallop.  Pulmonary:     Effort: Pulmonary effort is normal. No accessory muscle usage or respiratory distress.     Breath sounds: Normal breath sounds.  Abdominal:     General: Bowel sounds are normal.     Palpations: Abdomen is soft. There is no hepatomegaly or splenomegaly.     Tenderness: There is no abdominal tenderness.  Musculoskeletal:        General: Normal range of motion.     Cervical back: Normal range of motion and neck supple.     Right lower leg: No edema.     Left lower leg: No edema.  Lymphadenopathy:     Head:     Right side of head: No submental, submandibular, tonsillar, preauricular or posterior auricular adenopathy.     Left side of head: No submental, submandibular, tonsillar, preauricular or posterior auricular adenopathy.     Cervical: No cervical adenopathy.  Skin:    General: Skin is warm and dry.     Capillary Refill: Capillary refill takes less than 2 seconds.     Findings: No rash.  Neurological:     Mental Status: She is alert and oriented to person, place, and time.     Gait: Gait is intact.     Deep Tendon Reflexes: Reflexes are normal and symmetric.     Reflex Scores:      Brachioradialis reflexes are 2+ on the right side and 2+ on the left side.      Patellar reflexes are 2+ on the right side and 2+ on the left side. Psychiatric:        Attention and Perception: Attention normal.        Mood and Affect: Mood normal.        Speech: Speech normal.        Behavior: Behavior normal. Behavior is cooperative.        Thought Content: Thought content normal.        Judgment: Judgment normal.    Results for orders placed or performed in visit on 01/08/19  Microscopic  Examination   URINE  Result Value Ref Range   WBC, UA 0-5 0 - 5 /hpf   RBC 0-2 0 - 2 /hpf   Epithelial Cells (non renal) 0-10 0 - 10 /hpf   Bacteria, UA Moderate (A) None seen/Few  Urine Culture, Reflex   URINE  Result Value Ref Range   Urine Culture, Routine Final report    Organism ID, Bacteria Lactobacillus species   CBC with Differential/Platelet  Result Value Ref Range   WBC 6.1 3.4 - 10.8 x10E3/uL   RBC 3.97 3.77 - 5.28 x10E6/uL   Hemoglobin 11.9 11.1 - 15.9 g/dL   Hematocrit 16.1 09.6 - 46.6 %   MCV 91 79 - 97 fL   MCH 30.0 26.6 - 33.0 pg   MCHC 33.1 31.5 - 35.7 g/dL   RDW 04.5 40.9 - 81.1 %   Platelets 290 150 - 450 x10E3/uL   Neutrophils 45 Not Estab. %   Lymphs 44 Not Estab. %   Monocytes 7 Not Estab. %   Eos 3 Not Estab. %   Basos 1 Not Estab. %   Neutrophils Absolute 2.7 1.4 - 7.0 x10E3/uL   Lymphocytes Absolute 2.8 0.7 - 3.1 x10E3/uL   Monocytes Absolute 0.4 0.1 - 0.9 x10E3/uL  EOS (ABSOLUTE) 0.2 0.0 - 0.4 x10E3/uL   Basophils Absolute 0.0 0.0 - 0.2 x10E3/uL   Immature Granulocytes 0 Not Estab. %   Immature Grans (Abs) 0.0 0.0 - 0.1 x10E3/uL  Comprehensive metabolic panel  Result Value Ref Range   Glucose 91 65 - 99 mg/dL   BUN 13 6 - 24 mg/dL   Creatinine, Ser 0.86 0.57 - 1.00 mg/dL   GFR calc non Af Amer 99 >59 mL/min/1.73   GFR calc Af Amer 114 >59 mL/min/1.73   BUN/Creatinine Ratio 18 9 - 23   Sodium 140 134 - 144 mmol/L   Potassium 4.3 3.5 - 5.2 mmol/L   Chloride 104 96 - 106 mmol/L   CO2 24 20 - 29 mmol/L   Calcium 9.4 8.7 - 10.2 mg/dL   Total Protein 7.0 6.0 - 8.5 g/dL   Albumin 4.6 3.8 - 4.8 g/dL   Globulin, Total 2.4 1.5 - 4.5 g/dL   Albumin/Globulin Ratio 1.9 1.2 - 2.2   Bilirubin Total 0.3 0.0 - 1.2 mg/dL   Alkaline Phosphatase 67 39 - 117 IU/L   AST 18 0 - 40 IU/L   ALT 12 0 - 32 IU/L  UA/M w/rflx Culture, Routine   Specimen: Urine   URINE  Result Value Ref Range   Specific Gravity, UA 1.020 1.005 - 1.030   pH, UA 5.0 5.0 - 7.5    Color, UA Yellow Yellow   Appearance Ur Clear Clear   Leukocytes,UA 2+ (A) Negative   Protein,UA Negative Negative/Trace   Glucose, UA Negative Negative   Ketones, UA Negative Negative   RBC, UA 1+ (A) Negative   Bilirubin, UA Negative Negative   Urobilinogen, Ur 0.2 0.2 - 1.0 mg/dL   Nitrite, UA Negative Negative   Microscopic Examination See below:    Urinalysis Reflex Comment       Assessment & Plan:   Problem List Items Addressed This Visit       Other   Anxiety - Primary    Chronic, ongoing.  Denies SI/HI.  Continue current medication regimen and adjust as needed.  Labs today.      Relevant Orders   TSH   Other Visit Diagnoses     Vitamin D deficiency       History of low levels, check today and start supplement as needed.   Relevant Orders   VITAMIN D 25 Hydroxy (Vit-D Deficiency, Fractures)   History of anemia       History of low levels, check today and start supplement as needed.   Relevant Orders   Iron Binding Cap (TIBC)([facility])   Ferritin   Need for hepatitis C screening test       Hep C screening on labs today, discussed with patient.   Relevant Orders   Hepatitis C antibody   Encounter for lipid screening for cardiovascular disease       Lipid panel on labs today.   Relevant Orders   Comprehensive metabolic panel   Lipid Panel w/o Chol/HDL Ratio   Colon cancer screening       Cologuard ordered   Relevant Orders   Cologuard   Encounter for annual physical exam       Annual physical with labs and health maintenance reviewed.   Relevant Orders   CBC with Differential/Platelet   TSH        Follow up plan: Return in about 6 months (around 04/17/2022) for Anxiety and Weight Talk.   LABORATORY TESTING:  - Pap smear:  up to date  IMMUNIZATIONS:   - Tdap: Tetanus vaccination status reviewed: last tetanus booster within 10 years. - Influenza: Up to date - Pneumovax: Not applicable - Prevnar: Not applicable - COVID: Up to date - HPV:  Not applicable - Shingrix vaccine: Not applicable  SCREENING: -Mammogram: Up to date  - Colonoscopy:  Cologuard only   - Bone Density: Not applicable  -Hearing Test: Not applicable  -Spirometry: Not applicable   PATIENT COUNSELING:   Advised to take 1 mg of folate supplement per day if capable of pregnancy.   Sexuality: Discussed sexually transmitted diseases, partner selection, use of condoms, avoidance of unintended pregnancy  and contraceptive alternatives.   Advised to avoid cigarette smoking.  I discussed with the patient that most people either abstain from alcohol or drink within safe limits (<=14/week and <=4 drinks/occasion for males, <=7/weeks and <= 3 drinks/occasion for females) and that the risk for alcohol disorders and other health effects rises proportionally with the number of drinks per week and how often a drinker exceeds daily limits.  Discussed cessation/primary prevention of drug use and availability of treatment for abuse.   Diet: Encouraged to adjust caloric intake to maintain  or achieve ideal body weight, to reduce intake of dietary saturated fat and total fat, to limit sodium intake by avoiding high sodium foods and not adding table salt, and to maintain adequate dietary potassium and calcium preferably from fresh fruits, vegetables, and low-fat dairy products.    stressed the importance of regular exercise  Injury prevention: Discussed safety belts, safety helmets, smoke detector, smoking near bedding or upholstery.   Dental health: Discussed importance of regular tooth brushing, flossing, and dental visits.    NEXT PREVENTATIVE PHYSICAL DUE IN 1 YEAR. Return in about 6 months (around 04/17/2022) for Anxiety and Weight Talk.

## 2021-10-16 LAB — HEPATITIS C ANTIBODY: Hep C Virus Ab: NONREACTIVE

## 2021-10-16 LAB — CBC WITH DIFFERENTIAL/PLATELET
Basophils Absolute: 0.1 10*3/uL (ref 0.0–0.2)
Basos: 1 %
EOS (ABSOLUTE): 0.3 10*3/uL (ref 0.0–0.4)
Eos: 7 %
Hematocrit: 35.9 % (ref 34.0–46.6)
Hemoglobin: 11.9 g/dL (ref 11.1–15.9)
Immature Grans (Abs): 0 10*3/uL (ref 0.0–0.1)
Immature Granulocytes: 0 %
Lymphocytes Absolute: 1.9 10*3/uL (ref 0.7–3.1)
Lymphs: 36 %
MCH: 29.5 pg (ref 26.6–33.0)
MCHC: 33.1 g/dL (ref 31.5–35.7)
MCV: 89 fL (ref 79–97)
Monocytes Absolute: 0.3 10*3/uL (ref 0.1–0.9)
Monocytes: 5 %
Neutrophils Absolute: 2.7 10*3/uL (ref 1.4–7.0)
Neutrophils: 51 %
Platelets: 346 10*3/uL (ref 150–450)
RBC: 4.04 x10E6/uL (ref 3.77–5.28)
RDW: 13.1 % (ref 11.7–15.4)
WBC: 5.2 10*3/uL (ref 3.4–10.8)

## 2021-10-16 LAB — IRON AND TIBC
Iron Saturation: 50 % (ref 15–55)
Iron: 162 ug/dL — ABNORMAL HIGH (ref 27–159)
Total Iron Binding Capacity: 322 ug/dL (ref 250–450)
UIBC: 160 ug/dL (ref 131–425)

## 2021-10-16 LAB — VITAMIN D 25 HYDROXY (VIT D DEFICIENCY, FRACTURES): Vit D, 25-Hydroxy: 33.4 ng/mL (ref 30.0–100.0)

## 2021-10-16 LAB — COMPREHENSIVE METABOLIC PANEL
ALT: 12 IU/L (ref 0–32)
AST: 20 IU/L (ref 0–40)
Albumin/Globulin Ratio: 1.6 (ref 1.2–2.2)
Albumin: 4.3 g/dL (ref 3.8–4.8)
Alkaline Phosphatase: 97 IU/L (ref 44–121)
BUN/Creatinine Ratio: 18 (ref 9–23)
BUN: 12 mg/dL (ref 6–24)
Bilirubin Total: 0.5 mg/dL (ref 0.0–1.2)
CO2: 24 mmol/L (ref 20–29)
Calcium: 9 mg/dL (ref 8.7–10.2)
Chloride: 106 mmol/L (ref 96–106)
Creatinine, Ser: 0.67 mg/dL (ref 0.57–1.00)
Globulin, Total: 2.7 g/dL (ref 1.5–4.5)
Glucose: 89 mg/dL (ref 70–99)
Potassium: 4.4 mmol/L (ref 3.5–5.2)
Sodium: 142 mmol/L (ref 134–144)
Total Protein: 7 g/dL (ref 6.0–8.5)
eGFR: 108 mL/min/{1.73_m2} (ref >59)

## 2021-10-16 LAB — LIPID PANEL W/O CHOL/HDL RATIO
Cholesterol, Total: 193 mg/dL (ref 100–199)
HDL: 62 mg/dL (ref >39)
LDL Chol Calc (NIH): 117 mg/dL — ABNORMAL HIGH (ref 0–99)
Triglycerides: 76 mg/dL (ref 0–149)
VLDL Cholesterol Cal: 14 mg/dL (ref 5–40)

## 2021-10-16 LAB — FERRITIN: Ferritin: 39 ng/mL (ref 15–150)

## 2021-10-16 LAB — TSH: TSH: 1.17 u[IU]/mL (ref 0.450–4.500)

## 2021-10-16 NOTE — Progress Notes (Signed)
Contacted via [place] ? ?Good evening Jennine, your labs have returned and overall look great with a couple exceptions: ?- Iron level is a little high, if you are taking iron supplement or multivitamin with iron I recommend you cut back to only a few days a week of taking. ?- Your LDL is above normal. ?The LDL is the bad cholesterol. ?Over time and in combination with inflammation and other factors, this contributes to plaque which in turn may lead to stroke and/or heart attack down the road. ?Sometimes high LDL is primarily genetic, and people might be eating all the right foods but still have high numbers. ?Other times, there is room for improvement in one's diet and eating healthier can bring this number down and potentially reduce one's risk of heart attack and/or stroke.  ?? ?To reduce your LDL, Remember - more fruits and vegetables, more fish, and limit red meat and dairy products. ?More soy, nuts, beans, barley, lentils, oats and plant sterol ester enriched margarine instead of butter. ?I also encourage eliminating sugar and processed food. ?Remember, shop on the outside of the grocery store and visit your [employer]. ??If you would like to talk with me about dietary changes for your cholesterol, please let me know. We should recheck your cholesterol in 6 months.  Any questions? ?Keep being amazing!!  Thank you for allowing me to participate in your care.  I appreciate you. ?Kindest regards, ?Shamyah Stantz ? ?? ?

## 2021-11-11 LAB — COLOGUARD: COLOGUARD: NEGATIVE

## 2021-11-11 NOTE — Progress Notes (Signed)
Contacted via [place] ? ? ?Good news Holly Powell, your Cologuard is negative -- do not need to repeat for 3 years:)

## 2021-12-06 ENCOUNTER — Other Ambulatory Visit: Payer: Self-pay | Admitting: Nurse Practitioner

## 2021-12-06 DIAGNOSIS — Z1231 Encounter for screening mammogram for malignant neoplasm of breast: Secondary | ICD-10-CM

## 2022-01-27 ENCOUNTER — Ambulatory Visit
Admission: RE | Admit: 2022-01-27 | Discharge: 2022-01-27 | Disposition: A | Discharge status: Home / Self Care | Payer: No Typology Code available for payment source | Source: Ambulatory Visit | Attending: Nurse Practitioner | Admitting: Nurse Practitioner

## 2022-01-27 DIAGNOSIS — Z1231 Encounter for screening mammogram for malignant neoplasm of breast: Secondary | ICD-10-CM | POA: Diagnosis present

## 2022-01-27 NOTE — Progress Notes (Signed)
Contacted via MyChart   Normal mammogram, may repeat in one year:)

## 2022-01-29 ENCOUNTER — Emergency Department
Admission: EM | Admit: 2022-01-29 | Discharge: 2022-01-29 | Disposition: A | Discharge status: Home / Self Care | Payer: No Typology Code available for payment source | Attending: Emergency Medicine | Admitting: Emergency Medicine

## 2022-01-29 ENCOUNTER — Other Ambulatory Visit: Payer: Self-pay

## 2022-01-29 ENCOUNTER — Encounter: Payer: Self-pay | Admitting: Emergency Medicine

## 2022-01-29 DIAGNOSIS — R42 Dizziness and giddiness: Secondary | ICD-10-CM | POA: Diagnosis not present

## 2022-01-29 DIAGNOSIS — R55 Syncope and collapse: Secondary | ICD-10-CM | POA: Insufficient documentation

## 2022-01-29 LAB — CBC WITH DIFFERENTIAL/PLATELET
Abs Immature Granulocytes: 0.03 10*3/uL (ref 0.00–0.07)
Basophils Absolute: 0.1 10*3/uL (ref 0.0–0.1)
Basophils Relative: 1 %
Eosinophils Absolute: 0.4 10*3/uL (ref 0.0–0.5)
Eosinophils Relative: 5 %
HCT: 35.1 % — ABNORMAL LOW (ref 36.0–46.0)
Hemoglobin: 11.9 g/dL — ABNORMAL LOW (ref 12.0–15.0)
Immature Granulocytes: 0 %
Lymphocytes Relative: 44 %
Lymphs Abs: 4 10*3/uL (ref 0.7–4.0)
MCH: 29.2 pg (ref 26.0–34.0)
MCHC: 33.9 g/dL (ref 30.0–36.0)
MCV: 86 fL (ref 80.0–100.0)
Monocytes Absolute: 0.6 10*3/uL (ref 0.1–1.0)
Monocytes Relative: 6 %
Neutro Abs: 4.1 10*3/uL (ref 1.7–7.7)
Neutrophils Relative %: 44 %
Platelets: 354 10*3/uL (ref 150–400)
RBC: 4.08 MIL/uL (ref 3.87–5.11)
RDW: 12.7 % (ref 11.5–15.5)
WBC: 9.2 10*3/uL (ref 4.0–10.5)
nRBC: 0 % (ref 0.0–0.2)

## 2022-01-29 LAB — BASIC METABOLIC PANEL
Anion gap: 8 (ref 5–15)
BUN: 11 mg/dL (ref 6–20)
CO2: 22 mmol/L (ref 22–32)
Calcium: 9.1 mg/dL (ref 8.9–10.3)
Chloride: 110 mmol/L (ref 98–111)
Creatinine, Ser: 0.78 mg/dL (ref 0.44–1.00)
GFR, Estimated: >60 mL/min (ref >60)
Glucose, Bld: 130 mg/dL — ABNORMAL HIGH (ref 70–99)
Potassium: 3.4 mmol/L — ABNORMAL LOW (ref 3.5–5.1)
Sodium: 140 mmol/L (ref 135–145)

## 2022-01-29 LAB — TROPONIN I (HIGH SENSITIVITY): Troponin I (High Sensitivity): <2 ng/L (ref <18)

## 2022-01-29 MED ORDER — SODIUM CHLORIDE 0.9 % IV BOLUS
1000.0000 mL | Freq: Once | INTRAVENOUS | Status: AC
  Administered 2022-01-29: 1000 mL via INTRAVENOUS

## 2022-01-29 NOTE — ED Triage Notes (Signed)
Pt presents from home via EMS. Reports she passed out twice prior to arrival, denies any head injury. Per EMS BP lying down 112/64, sitting up 93/55. Pt talks in complete sentences no respiratory distress noted

## 2022-01-29 NOTE — ED Provider Notes (Signed)
[facility] Provider Note    Event Date/Time   First MD Initiated Contact with Patient 01/29/22 Paulo Fruit     (approximate)   History   Loss of Consciousness   HPI  Holly Powell is a 46 y.o. female  who, per cardiology note dated 01/13/2017 had been evaluated for presyncope, who presents to the emergency department today because of concern for syncope.  Patient states that it is not unusual for her to pass out especially if she has to vomit.  She also states that she does tend to get dizzy when she wakes up.  Patient does work night shift so was getting up from being asleep this afternoon when she started feeling a little dizzy.  She then went into the kitchen and the husband noticed that she looked like she was about to pass out.  He was able to catch her before she fell to the ground.  They then went into the bedroom when the patient passed out again.  The patient states that the time of my exam that she is feeling better.  She denies feeling any chest pain or palpitations.  She denies any recent illness.      Physical Exam   Triage Vital Signs: ED Triage Vitals  Enc Vitals Group     BP 01/29/22 1827 114/63     Pulse Rate 01/29/22 1827 76     Resp 01/29/22 1827 (!) 22     Temp 01/29/22 1827 97.6 F (36.4 C)     Temp Source 01/29/22 1827 Oral     SpO2 01/29/22 1827 98 %     Weight 01/29/22 1828 190 lb (86.2 kg)     Height 01/29/22 1828 5\' 3"  (1.6 m)     Head Circumference --      Peak Flow --      Pain Score 01/29/22 1828 0     Pain Loc --      Pain Edu? --      Excl. in GC? --     Most recent vital signs: Vitals:   01/29/22 1827  BP: 114/63  Pulse: 76  Resp: (!) 22  Temp: 97.6 F (36.4 C)  SpO2: 98%    General: Awake, alert and oriented. CV:  Good peripheral perfusion. Regular rate and rhythm. Resp:  Normal effort. Lungs clear. Abd:  No distention. Non tender. Other:  No lower extremity edema.   ED Results / Procedures / Treatments    Labs (all labs ordered are listed, but only abnormal results are displayed) Labs Reviewed  CBC WITH DIFFERENTIAL/PLATELET - Abnormal; Notable for the following components:      Result Value   Hemoglobin 11.9 (*)    HCT 35.1 (*)    All other components within normal limits  BASIC METABOLIC PANEL - Abnormal; Notable for the following components:   Potassium 3.4 (*)    Glucose, Bld 130 (*)    All other components within normal limits  TROPONIN I (HIGH SENSITIVITY)  TROPONIN I (HIGH SENSITIVITY)     EKG  I, Phineas Semen, attending physician, personally viewed and interpreted this EKG  EKG Time: 1820 Rate: 78 Rhythm: sinus rhythm Axis: normal Intervals: qtc 457 QRS: narrow ST changes: no st elevation Impression: normal ekg  RADIOLOGY None  PROCEDURES:  Critical Care performed: No  Procedures   MEDICATIONS ORDERED IN ED: Medications - No data to display   IMPRESSION / MDM / ASSESSMENT AND PLAN / ED COURSE  I  reviewed the triage vital signs and the nursing notes.                              Differential diagnosis includes, but is not limited to, anemia, arrhythmia, ACS.   Patient's presentation is most consistent with acute presentation with potential threat to life or bodily function.  Patient presented to the emergency department today because of concerns for syncope.  Per patient she has syncopized in the past.  Blood work here without concerning anemia or electrolyte abnormality.  COVID was negative.  EKG without concerning arrhythmia.  I did discuss these findings with the patient.  Did discuss possibility of obtaining second troponin however patient felt comfortable deferring and I think this is reasonable.  Patient has been evaluated by cardiology in the past so I do recommend following up with cardiology.  FINAL CLINICAL IMPRESSION(S) / ED DIAGNOSES   Final diagnoses:  Syncope, unspecified syncope type      Note:  This document was prepared  using Dragon voice recognition software and may include unintentional dictation errors.    Phineas Semen, MD 01/29/22 2059

## 2022-02-11 ENCOUNTER — Other Ambulatory Visit: Payer: Self-pay

## 2022-04-17 NOTE — Patient Instructions (Signed)

## 2022-04-20 ENCOUNTER — Encounter: Payer: Self-pay | Admitting: Nurse Practitioner

## 2022-04-20 ENCOUNTER — Other Ambulatory Visit: Payer: Self-pay

## 2022-04-20 ENCOUNTER — Ambulatory Visit (INDEPENDENT_AMBULATORY_CARE_PROVIDER_SITE_OTHER): Payer: No Typology Code available for payment source | Admitting: Nurse Practitioner

## 2022-04-20 VITALS — BP 107/72 | HR 66 | Temp 98.1°F | Wt 195.2 lb

## 2022-04-20 DIAGNOSIS — F419 Anxiety disorder, unspecified: Secondary | ICD-10-CM

## 2022-04-20 DIAGNOSIS — E6609 Other obesity due to excess calories: Secondary | ICD-10-CM | POA: Insufficient documentation

## 2022-04-20 DIAGNOSIS — Z6834 Body mass index (BMI) 34.0-34.9, adult: Secondary | ICD-10-CM | POA: Diagnosis not present

## 2022-04-20 MED ORDER — WEGOVY 0.5 MG/0.5ML ~~LOC~~ SOAJ
0.5000 mg | SUBCUTANEOUS | 1 refills | Status: DC
  Filled 2022-04-20: qty 2, fill #0
  Filled 2022-08-25: qty 2, 28d supply, fill #0

## 2022-04-20 MED ORDER — WEGOVY 0.25 MG/0.5ML ~~LOC~~ SOAJ
0.2500 mg | SUBCUTANEOUS | 1 refills | Status: DC
  Filled 2022-04-20 – 2022-04-21 (×2): qty 2, fill #0
  Filled 2022-04-25 – 2022-08-16 (×4): qty 2, 28d supply, fill #0

## 2022-04-20 NOTE — Progress Notes (Signed)
BP 107/72   Pulse 66   Temp 98.1 F (36.7 C) (Oral)   Wt 195 lb 3.2 oz (88.5 kg)   SpO2 99%   BMI 34.58 kg/m    Subjective:    Patient ID: Holly Powell, female    DOB: 1976/02/21, 46 y.o.   MRN: [id number]  HPI: Holly Powell is a 46 y.o. female  Chief Complaint  Patient presents with   Anxiety   Weight Check   ANXIETY/STRESS Continues Lexapro 20 MG daily.  Weight continues to be a struggle.  She continues to want to lose weight.  Has tried over past 6 months to lose weight with reduction in sugar and diet changes + going for walks -- not as much when goes home as works two jobs.  Her goal weight loss would be 145 to 150 lbs.  She is worried about sugars and A1c, has history of elevation in sugar on labs -- recent has been stable.  Her lowest weight in past was 135 lbs, but she felt this was too small. No family history of thyroid cancer (MTC, MEN 2, thyroid cell tumors) or pancreatitis.   Duration:stable Anxious mood: no  Excessive worrying: no Irritability: no  Sweating: no Nausea: no Palpitations:no Hyperventilation: no Panic attacks: no Agoraphobia: no  Obscessions/compulsions: yes -- has OCD underlying Depressed mood: no    2022/04/21    8:31 AM 10/15/2021   10:50 AM 01/07/2020    9:42 AM 01/08/2019    3:35 PM 11/10/2017    9:37 AM  Depression screen PHQ 2/9  Decreased Interest 0 0 0 0 0  Down, Depressed, Hopeless 0 0 0 0 0  PHQ - 2 Score 0 0 0 0 0  Altered sleeping 1 0 0 0 0  Tired, decreased energy 1 2 0 0 0  Change in appetite $RemoveBef'3 3 3 'TmDeTEPIec$ 0 2  Feeling bad or failure about yourself  0 0 0 0 0  Trouble concentrating 0 0 0 0 1  Moving slowly or fidgety/restless 0 0 0 0 0  Suicidal thoughts 0 0 0 0 0  PHQ-9 Score $RemoveBef'5 5 3 'NfsYbVyoDV$ 0 3  Difficult doing work/chores Not difficult at all  Not difficult at all Not difficult at all   Anhedonia: no Weight changes: yes Insomnia: a couple nights Hypersomnia: no Fatigue/loss of energy: no Feelings of worthlessness: no Feelings of  guilt: no Impaired concentration/indecisiveness: no Suicidal ideations: no  Crying spells: no Recent Stressors/Life Changes: yes   Relationship problems: no   Family stress: no     Financial stress: no    Job stress: yes    Recent death/loss: no     Apr 21, 2022    8:31 AM 10/15/2021   10:50 AM 01/07/2020    9:44 AM 01/08/2019    3:34 PM  GAD 7 : Generalized Anxiety Score  Nervous, Anxious, on Edge 0 0 1 0  Control/stop worrying 0 0 0 0  Worry too much - different things 0 0 0 0  Trouble relaxing $RemoveBeforeDE'1 2 3 1  'zNINtlOnmVGrrYx$ Restless 0 0 2 1  Easily annoyed or irritable 0 $RemoveBef'1 2 1  'gyjdacxWby$ Afraid - awful might happen 0 0 0 0  Total GAD 7 Score $Remov'1 3 8 3  'iUPOjg$ Anxiety Difficulty  Not difficult at all Not difficult at all Not difficult at all    Relevant past medical, surgical, family and social history reviewed and updated as indicated. Interim medical history since our last visit reviewed. Allergies and  medications reviewed and updated.  Review of Systems  Constitutional:  Negative for activity change, appetite change, diaphoresis, fatigue and fever.  Respiratory:  Negative for cough, chest tightness and shortness of breath.   Cardiovascular:  Negative for chest pain, palpitations and leg swelling.  Gastrointestinal: Negative.   Endocrine: Negative.   Neurological:  Negative for dizziness, syncope, weakness, light-headedness, numbness and headaches.  Psychiatric/Behavioral: Negative.      Per HPI unless specifically indicated above     Objective:    BP 107/72   Pulse 66   Temp 98.1 F (36.7 C) (Oral)   Wt 195 lb 3.2 oz (88.5 kg)   SpO2 99%   BMI 34.58 kg/m   Wt Readings from Last 3 Encounters:  04/20/22 195 lb 3.2 oz (88.5 kg)  10/15/21 191 lb 9.6 oz (86.9 kg)  01/08/19 180 lb (81.6 kg)    Physical Exam Vitals and nursing note reviewed.  Constitutional:      General: She is awake. She is not in acute distress.    Appearance: She is well-developed and well-groomed. She is obese. She is not  ill-appearing or toxic-appearing.  HENT:     Head: Normocephalic.     Right Ear: Hearing normal.     Left Ear: Hearing normal.  Eyes:     General: Lids are normal.        Right eye: No discharge.        Left eye: No discharge.     Conjunctiva/sclera: Conjunctivae normal.     Pupils: Pupils are equal, round, and reactive to light.  Neck:     Thyroid: No thyromegaly.     Vascular: No carotid bruit.  Cardiovascular:     Rate and Rhythm: Normal rate and regular rhythm.     Heart sounds: Normal heart sounds. No murmur heard.    No gallop.  Pulmonary:     Effort: Pulmonary effort is normal. No accessory muscle usage or respiratory distress.     Breath sounds: Normal breath sounds.  Abdominal:     General: Bowel sounds are normal.     Palpations: Abdomen is soft. There is no hepatomegaly or splenomegaly.  Musculoskeletal:     Cervical back: Normal range of motion and neck supple.     Right lower leg: No edema.     Left lower leg: No edema.  Skin:    General: Skin is warm and dry.  Neurological:     Mental Status: She is alert and oriented to person, place, and time.  Psychiatric:        Attention and Perception: Attention normal.        Mood and Affect: Mood normal.        Behavior: Behavior normal. Behavior is cooperative.        Thought Content: Thought content normal.        Judgment: Judgment normal.     Results for orders placed or performed in visit on 10/15/21  CBC with Differential/Platelet  Result Value Ref Range   WBC 5.2 3.4 - 10.8 x10E3/uL   RBC 4.04 3.77 - 5.28 x10E6/uL   Hemoglobin 11.9 11.1 - 15.9 g/dL   Hematocrit 35.9 34.0 - 46.6 %   MCV 89 79 - 97 fL   MCH 29.5 26.6 - 33.0 pg   MCHC 33.1 31.5 - 35.7 g/dL   RDW 13.1 11.7 - 15.4 %   Platelets 346 150 - 450 x10E3/uL   Neutrophils 51 Not Estab. %   Lymphs 36  Not Estab. %   Monocytes 5 Not Estab. %   Eos 7 Not Estab. %   Basos 1 Not Estab. %   Neutrophils Absolute 2.7 1.4 - 7.0 x10E3/uL   Lymphocytes  Absolute 1.9 0.7 - 3.1 x10E3/uL   Monocytes Absolute 0.3 0.1 - 0.9 x10E3/uL   EOS (ABSOLUTE) 0.3 0.0 - 0.4 x10E3/uL   Basophils Absolute 0.1 0.0 - 0.2 x10E3/uL   Immature Granulocytes 0 Not Estab. %   Immature Grans (Abs) 0.0 0.0 - 0.1 x10E3/uL  Comprehensive metabolic panel  Result Value Ref Range   Glucose 89 70 - 99 mg/dL   BUN 12 6 - 24 mg/dL   Creatinine, Ser 4.43 0.57 - 1.00 mg/dL   eGFR 065 >99 MQ/LUH/1.00   BUN/Creatinine Ratio 18 9 - 23   Sodium 142 134 - 144 mmol/L   Potassium 4.4 3.5 - 5.2 mmol/L   Chloride 106 96 - 106 mmol/L   CO2 24 20 - 29 mmol/L   Calcium 9.0 8.7 - 10.2 mg/dL   Total Protein 7.0 6.0 - 8.5 g/dL   Albumin 4.3 3.8 - 4.8 g/dL   Globulin, Total 2.7 1.5 - 4.5 g/dL   Albumin/Globulin Ratio 1.6 1.2 - 2.2   Bilirubin Total 0.5 0.0 - 1.2 mg/dL   Alkaline Phosphatase 97 44 - 121 IU/L   AST 20 0 - 40 IU/L   ALT 12 0 - 32 IU/L  Lipid Panel w/o Chol/HDL Ratio  Result Value Ref Range   Cholesterol, Total 193 100 - 199 mg/dL   Triglycerides 76 0 - 149 mg/dL   HDL 62 >04 mg/dL   VLDL Cholesterol Cal 14 5 - 40 mg/dL   LDL Chol Calc (NIH) 382 (H) 0 - 99 mg/dL  TSH  Result Value Ref Range   TSH 1.170 0.450 - 4.500 uIU/mL  VITAMIN D 25 Hydroxy (Vit-D Deficiency, Fractures)  Result Value Ref Range   Vit D, 25-Hydroxy 33.4 30.0 - 100.0 ng/mL  Hepatitis C antibody  Result Value Ref Range   Hep C Virus Ab Non Reactive Non Reactive  Iron Binding Cap (TIBC)([facility])  Result Value Ref Range   Total Iron Binding Capacity 322 250 - 450 ug/dL   UIBC 896 662 - 703 ug/dL   Iron 757 (H) 27 - 077 ug/dL   Iron Saturation 50 15 - 55 %  Ferritin  Result Value Ref Range   Ferritin 39 15 - 150 ng/mL  Cologuard  Result Value Ref Range   COLOGUARD Negative Negative      Assessment & Plan:   Problem List Items Addressed This Visit       Other   Anxiety    Chronic, ongoing.  Denies SI/HI.  Continue current medication regimen and adjust as needed.  Labs up  to date.      Class 1 obesity due to excess calories without serious comorbidity with body mass index (BMI) of 34.0 to 34.9 in adult - Primary    BMI 34.58.  Discussed weight loss options at length with patient, she would like to start [facility].  Educated on this medication and risks/benefits.   No family history of thyroid cancer (MTC, MEN 2, thyroid cell tumors) or pancreatitis.  Scripts sent.  Recommended eating smaller high protein, low fat meals more frequently and exercising 30 mins a day 5 times a week with a goal of 10-15lb weight loss in the next 3 months. Patient voiced their understanding and motivation to adhere to these recommendations.  Relevant Medications   Semaglutide-Weight Management (WEGOVY) 0.25 MG/0.5ML SOAJ   Semaglutide-Weight Management (WEGOVY) 0.5 MG/0.5ML SOAJ (Start on 05/23/2022)     Follow up plan: Return in about 5 weeks (around 05/25/2022) for WEIGHT CHECK.

## 2022-04-20 NOTE — Assessment & Plan Note (Signed)
Chronic, ongoing.  Denies SI/HI.  Continue current medication regimen and adjust as needed.  Labs up to date.

## 2022-04-20 NOTE — Assessment & Plan Note (Addendum)
BMI 34.58.  Discussed weight loss options at length with patient, she would like to start [facility]  Educated on this medication and risks/benefits.   No family history of thyroid cancer (MTC, MEN 2, thyroid cell tumors) or pancreatitis.  Scripts sent.  Recommended eating smaller high protein, low fat meals more frequently and exercising 30 mins a day 5 times a week with a goal of 10-15lb weight loss in the next 3 months. Patient voiced their understanding and motivation to adhere to these recommendations.

## 2022-04-22 ENCOUNTER — Other Ambulatory Visit: Payer: Self-pay

## 2022-04-25 ENCOUNTER — Encounter: Payer: Self-pay | Admitting: Nurse Practitioner

## 2022-04-25 ENCOUNTER — Other Ambulatory Visit: Payer: Self-pay

## 2022-04-26 ENCOUNTER — Telehealth: Payer: Self-pay

## 2022-04-26 NOTE — Telephone Encounter (Signed)
Prior authorization was initiated via CoverMyMeds for prescription Wegovy 0.25/0.5ML. Awaiting determination from patient's insurance.   KEY: [id number]

## 2022-05-03 ENCOUNTER — Other Ambulatory Visit: Payer: Self-pay

## 2022-05-20 ENCOUNTER — Other Ambulatory Visit: Payer: Self-pay

## 2022-05-25 ENCOUNTER — Ambulatory Visit: Payer: No Typology Code available for payment source | Admitting: Nurse Practitioner

## 2022-05-26 ENCOUNTER — Other Ambulatory Visit: Payer: Self-pay

## 2022-05-30 ENCOUNTER — Other Ambulatory Visit: Payer: Self-pay

## 2022-06-15 ENCOUNTER — Encounter: Payer: Self-pay | Admitting: Nurse Practitioner

## 2022-06-22 ENCOUNTER — Other Ambulatory Visit: Payer: Self-pay

## 2022-08-16 ENCOUNTER — Other Ambulatory Visit: Payer: Self-pay

## 2022-08-19 ENCOUNTER — Other Ambulatory Visit: Payer: Self-pay

## 2022-08-24 ENCOUNTER — Other Ambulatory Visit: Payer: Self-pay

## 2022-08-25 ENCOUNTER — Other Ambulatory Visit: Payer: Self-pay

## 2022-09-12 ENCOUNTER — Other Ambulatory Visit: Payer: Self-pay

## 2022-10-09 NOTE — Patient Instructions (Signed)

## 2022-10-14 ENCOUNTER — Other Ambulatory Visit: Payer: Self-pay

## 2022-10-14 ENCOUNTER — Encounter: Payer: Self-pay | Admitting: Nurse Practitioner

## 2022-10-14 ENCOUNTER — Ambulatory Visit (INDEPENDENT_AMBULATORY_CARE_PROVIDER_SITE_OTHER): Payer: 59 | Admitting: Nurse Practitioner

## 2022-10-14 VITALS — BP 108/75 | HR 86 | Temp 97.8°F | Ht 64.02 in | Wt 193.3 lb

## 2022-10-14 DIAGNOSIS — Z6834 Body mass index (BMI) 34.0-34.9, adult: Secondary | ICD-10-CM

## 2022-10-14 DIAGNOSIS — E6609 Other obesity due to excess calories: Secondary | ICD-10-CM | POA: Diagnosis not present

## 2022-10-14 MED ORDER — WEGOVY 1 MG/0.5ML ~~LOC~~ SOAJ
1.0000 mg | SUBCUTANEOUS | 4 refills | Status: DC
  Filled 2022-10-14: qty 2, 28d supply, fill #0

## 2022-10-14 NOTE — Progress Notes (Signed)
BP 108/75   Pulse 86   Temp 97.8 F (36.6 C) (Oral)   Ht 5' 4.02" (1.626 m)   Wt 193 lb 4.8 oz (87.7 kg)   SpO2 98%   BMI 33.16 kg/m    Subjective:    Patient ID: Holly Powell, female    DOB: December 04, 1975, 47 y.o.   MRN: [id number]  HPI: Holly Powell is a 47 y.o. female  Chief Complaint  Patient presents with   Weight Check    Wegovy f/u   WEIGHT LOSS MANAGEMENT: Started on Wegovy on 04/20/2022 with weight 195 lbs -- has lost 2 lbs at this time taking 0.5 MG weekly dosing.  This was her second week of 0.5 MG dosing.  She felt the 0.25 MG dosing did not get in as well as the 0.5 MG dosing has.  Did have some general abdominal discomfort with initial 0.5 MG dosing -- this has gotten better with 2nd dose.  Weight continues to be a struggle for her.  She did try for over 6 months to lose weight with reduction in sugar and diet changes + going for walks -- not as much when goes home as works two jobs.  Her goal weight loss would be 145 to 150 lbs.  She is worried about sugars and A1c, recent labs glucose 89.  Her lowest weight in past was 135 lbs, but she felt this was too small. No family history of thyroid cancer (MTC, MEN 2, thyroid cell tumors) or pancreatitis.    Concern is that insurance will no longer be covering as of April 1st, 2024, but she is switching insurances to [employer] and then is starting a new job with [employer].    Relevant past medical, surgical, family and social history reviewed and updated as indicated. Interim medical history since our last visit reviewed. Allergies and medications reviewed and updated.  Review of Systems  Constitutional:  Negative for activity change, appetite change, diaphoresis, fatigue and fever.  Respiratory:  Negative for cough, chest tightness and shortness of breath.   Cardiovascular:  Negative for chest pain, palpitations and leg swelling.  Gastrointestinal: Negative.   Endocrine: Negative.    Neurological:  Negative for dizziness, syncope, weakness, light-headedness, numbness and headaches.  Psychiatric/Behavioral: Negative.      Per HPI unless specifically indicated above     Objective:    BP 108/75   Pulse 86   Temp 97.8 F (36.6 C) (Oral)   Ht 5' 4.02" (1.626 m)   Wt 193 lb 4.8 oz (87.7 kg)   SpO2 98%   BMI 33.16 kg/m   Wt Readings from Last 3 Encounters:  10/14/22 193 lb 4.8 oz (87.7 kg)  04/20/22 195 lb 3.2 oz (88.5 kg)  10/15/21 191 lb 9.6 oz (86.9 kg)    Physical Exam Vitals and nursing note reviewed.  Constitutional:      General: She is awake. She is not in acute distress.    Appearance: She is well-developed and well-groomed. She is obese. She is not ill-appearing or toxic-appearing.  HENT:     Head: Normocephalic.     Right Ear: Hearing normal.     Left Ear: Hearing normal.  Eyes:     General: Lids are normal.        Right eye: No discharge.        Left eye: No discharge.     Conjunctiva/sclera: Conjunctivae normal.     Pupils: Pupils are equal, round,  and reactive to light.  Neck:     Thyroid: No thyromegaly.     Vascular: No carotid bruit.  Cardiovascular:     Rate and Rhythm: Normal rate and regular rhythm.     Heart sounds: Normal heart sounds. No murmur heard.    No gallop.  Pulmonary:     Effort: Pulmonary effort is normal. No accessory muscle usage or respiratory distress.     Breath sounds: Normal breath sounds.  Abdominal:     General: Bowel sounds are normal. There is no distension.     Palpations: Abdomen is soft.     Tenderness: There is no abdominal tenderness.  Musculoskeletal:     Cervical back: Normal range of motion and neck supple.     Right lower leg: No edema.     Left lower leg: No edema.  Skin:    General: Skin is warm and dry.  Neurological:     Mental Status: She is alert and oriented to person, place, and time.  Psychiatric:        Attention and Perception: Attention normal.        Mood and Affect: Mood  normal.        Behavior: Behavior normal. Behavior is cooperative.        Thought Content: Thought content normal.        Judgment: Judgment normal.    Results for orders placed or performed in visit on 10/15/21  CBC with Differential/Platelet  Result Value Ref Range   WBC 5.2 3.4 - 10.8 x10E3/uL   RBC 4.04 3.77 - 5.28 x10E6/uL   Hemoglobin 11.9 11.1 - 15.9 g/dL   Hematocrit 35.9 34.0 - 46.6 %   MCV 89 79 - 97 fL   MCH 29.5 26.6 - 33.0 pg   MCHC 33.1 31.5 - 35.7 g/dL   RDW 13.1 11.7 - 15.4 %   Platelets 346 150 - 450 x10E3/uL   Neutrophils 51 Not Estab. %   Lymphs 36 Not Estab. %   Monocytes 5 Not Estab. %   Eos 7 Not Estab. %   Basos 1 Not Estab. %   Neutrophils Absolute 2.7 1.4 - 7.0 x10E3/uL   Lymphocytes Absolute 1.9 0.7 - 3.1 x10E3/uL   Monocytes Absolute 0.3 0.1 - 0.9 x10E3/uL   EOS (ABSOLUTE) 0.3 0.0 - 0.4 x10E3/uL   Basophils Absolute 0.1 0.0 - 0.2 x10E3/uL   Immature Granulocytes 0 Not Estab. %   Immature Grans (Abs) 0.0 0.0 - 0.1 x10E3/uL  Comprehensive metabolic panel  Result Value Ref Range   Glucose 89 70 - 99 mg/dL   BUN 12 6 - 24 mg/dL   Creatinine, Ser 0.67 0.57 - 1.00 mg/dL   eGFR 108 >59 mL/min/1.73   BUN/Creatinine Ratio 18 9 - 23   Sodium 142 134 - 144 mmol/L   Potassium 4.4 3.5 - 5.2 mmol/L   Chloride 106 96 - 106 mmol/L   CO2 24 20 - 29 mmol/L   Calcium 9.0 8.7 - 10.2 mg/dL   Total Protein 7.0 6.0 - 8.5 g/dL   Albumin 4.3 3.8 - 4.8 g/dL   Globulin, Total 2.7 1.5 - 4.5 g/dL   Albumin/Globulin Ratio 1.6 1.2 - 2.2   Bilirubin Total 0.5 0.0 - 1.2 mg/dL   Alkaline Phosphatase 97 44 - 121 IU/L   AST 20 0 - 40 IU/L   ALT 12 0 - 32 IU/L  Lipid Panel w/o Chol/HDL Ratio  Result Value Ref Range   Cholesterol, Total 193  100 - 199 mg/dL   Triglycerides 76 0 - 149 mg/dL   HDL 62 >39 mg/dL   VLDL Cholesterol Cal 14 5 - 40 mg/dL   LDL Chol Calc (NIH) 117 (H) 0 - 99 mg/dL  TSH  Result Value Ref Range   TSH 1.170 0.450 - 4.500 uIU/mL  VITAMIN D 25  Hydroxy (Vit-D Deficiency, Fractures)  Result Value Ref Range   Vit D, 25-Hydroxy 33.4 30.0 - 100.0 ng/mL  Hepatitis C antibody  Result Value Ref Range   Hep C Virus Ab Non Reactive Non Reactive  Iron Binding Cap (TIBC)([facility])  Result Value Ref Range   Total Iron Binding Capacity 322 250 - 450 ug/dL   UIBC 160 131 - 425 ug/dL   Iron 162 (H) 27 - 159 ug/dL   Iron Saturation 50 15 - 55 %  Ferritin  Result Value Ref Range   Ferritin 39 15 - 150 ng/mL  Cologuard  Result Value Ref Range   COLOGUARD Negative Negative      Assessment & Plan:   Problem List Items Addressed This Visit       Other   Class 1 obesity due to excess calories without serious comorbidity with body mass index (BMI) of 34.0 to 34.9 in adult - Primary    BMI 33.16 with a couple pounds lost thus far on Wegovy. Tolerating at this time, will plan on increase to 1 MG dosing in the next few weeks if continues to tolerate current dosing.  Educated on this medication and risks/benefits.   No family history of thyroid cancer (MTC, MEN 2, thyroid cell tumors) or pancreatitis.  Recommended eating smaller high protein, low fat meals more frequently and exercising 30 mins a day 5 times a week with a goal of 10-15lb weight loss in the next 3 months. Patient voiced their understanding and motivation to adhere to these recommendations. Return in 6 weeks.       Relevant Medications   Semaglutide-Weight Management (WEGOVY) 1 MG/0.5ML SOAJ     Follow up plan: Return in about 6 weeks (around 11/25/2022) for Annual physical.

## 2022-10-14 NOTE — Assessment & Plan Note (Addendum)
BMI 33.16 with a couple pounds lost thus far on Wegovy. Tolerating at this time, will plan on increase to 1 MG dosing in the next few weeks if continues to tolerate current dosing.  Educated on this medication and risks/benefits.   No family history of thyroid cancer (MTC, MEN 2, thyroid cell tumors) or pancreatitis.  Recommended eating smaller high protein, low fat meals more frequently and exercising 30 mins a day 5 times a week with a goal of 10-15lb weight loss in the next 3 months. Patient voiced their understanding and motivation to adhere to these recommendations. Return in 6 weeks.

## 2022-10-19 ENCOUNTER — Encounter: Payer: Self-pay | Admitting: Nurse Practitioner

## 2022-10-20 ENCOUNTER — Other Ambulatory Visit: Payer: Self-pay

## 2022-10-20 MED ORDER — ONDANSETRON HCL 4 MG PO TABS
4.0000 mg | ORAL_TABLET | Freq: Three times a day (TID) | ORAL | 3 refills | Status: DC | PRN
  Filled 2022-10-20: qty 60, 20d supply, fill #0

## 2022-11-16 ENCOUNTER — Encounter: Payer: Self-pay | Admitting: Nurse Practitioner

## 2022-11-25 ENCOUNTER — Ambulatory Visit: Payer: Self-pay | Admitting: Nurse Practitioner

## 2022-12-22 ENCOUNTER — Other Ambulatory Visit: Payer: Self-pay | Admitting: Nurse Practitioner

## 2022-12-22 DIAGNOSIS — Z1231 Encounter for screening mammogram for malignant neoplasm of breast: Secondary | ICD-10-CM

## 2022-12-24 DIAGNOSIS — E559 Vitamin D deficiency, unspecified: Secondary | ICD-10-CM | POA: Insufficient documentation

## 2022-12-24 DIAGNOSIS — E7800 Pure hypercholesterolemia, unspecified: Secondary | ICD-10-CM | POA: Insufficient documentation

## 2022-12-24 NOTE — Patient Instructions (Signed)

## 2022-12-26 ENCOUNTER — Ambulatory Visit (INDEPENDENT_AMBULATORY_CARE_PROVIDER_SITE_OTHER): Payer: Commercial Managed Care - PPO | Admitting: Nurse Practitioner

## 2022-12-26 ENCOUNTER — Encounter: Payer: Self-pay | Admitting: Nurse Practitioner

## 2022-12-26 ENCOUNTER — Other Ambulatory Visit: Payer: Self-pay

## 2022-12-26 VITALS — BP 127/80 | HR 81 | Temp 98.0°F | Ht 64.02 in | Wt 191.0 lb

## 2022-12-26 DIAGNOSIS — E559 Vitamin D deficiency, unspecified: Secondary | ICD-10-CM

## 2022-12-26 DIAGNOSIS — E6609 Other obesity due to excess calories: Secondary | ICD-10-CM | POA: Diagnosis not present

## 2022-12-26 DIAGNOSIS — Z23 Encounter for immunization: Secondary | ICD-10-CM

## 2022-12-26 DIAGNOSIS — Z833 Family history of diabetes mellitus: Secondary | ICD-10-CM

## 2022-12-26 DIAGNOSIS — F419 Anxiety disorder, unspecified: Secondary | ICD-10-CM

## 2022-12-26 DIAGNOSIS — E7800 Pure hypercholesterolemia, unspecified: Secondary | ICD-10-CM | POA: Diagnosis not present

## 2022-12-26 DIAGNOSIS — Z0000 Encounter for general adult medical examination without abnormal findings: Secondary | ICD-10-CM | POA: Diagnosis not present

## 2022-12-26 DIAGNOSIS — Z6832 Body mass index (BMI) 32.0-32.9, adult: Secondary | ICD-10-CM

## 2022-12-26 MED ORDER — WEGOVY 1.7 MG/0.75ML ~~LOC~~ SOAJ
1.7000 mg | SUBCUTANEOUS | 4 refills | Status: DC
Start: 2022-12-26 — End: 2023-07-17
  Filled 2022-12-26: qty 3, 28d supply, fill #0

## 2022-12-26 NOTE — Assessment & Plan Note (Signed)
Chronic, ongoing.  Continue supplement at home daily and adjust as needed.  Labs today.

## 2022-12-26 NOTE — Assessment & Plan Note (Signed)
In mother, check A1c annually.

## 2022-12-26 NOTE — Progress Notes (Signed)
BP 127/80   Pulse 81   Temp 98 F (36.7 C) (Oral)   Ht 5' 4.02" (1.626 m)   Wt 191 lb (86.6 kg)   SpO2 97%   BMI 32.77 kg/m    Subjective:    Patient ID: Holly Powell, female    DOB: [birth year], 47 y.o.   MRN: [id number]  HPI: Holly Powell is a 47 y.o. female presenting on 12/26/2022 for comprehensive medical examination. Current medical complaints include:none  She currently lives with: significant other Menopausal Symptoms: no  ANXIETY Continues on Lexapro 10 MG daily (has been cutting in 1/2 due to changes in sex drive with 20 MG).    Taking Wegovy 1 MG for weight loss, pharmacy recommended she space it out some.  Is losing weight with this, gradual.  No nausea and vomiting, did have this initially with lower doses.  Occasional heart burn issues.  Does not notice much reduction in appetite.   Mood status: stable Satisfied with current treatment?: yes Symptom severity: mild  Duration of current treatment : chronic Side effects: no Medication compliance: good compliance Psychotherapy/counseling: yes in the past Previous psychiatric medications: Lexapro Depressed mood: no Anxious mood: no Anhedonia: no Significant weight loss or gain: no Insomnia: none Fatigue: yes Feelings of worthlessness or guilt: no Impaired concentration/indecisiveness: no Suicidal ideations: no Hopelessness: no Crying spells: no    12/26/2022    4:35 PM 10/14/2022    4:20 PM 04/20/2022    8:31 AM 10/15/2021   10:50 AM 01/07/2020    9:42 AM  Depression screen PHQ 2/9  Decreased Interest 0 0 0 0 0  Down, Depressed, Hopeless 0 0 0 0 0  PHQ - 2 Score 0 0 0 0 0  Altered sleeping 0 0 1 0 0  Tired, decreased energy 0 0 1 2 0  Change in appetite 2 2 3 3 3   Feeling bad or failure about yourself  0 0 0 0 0  Trouble concentrating 0 0 0 0 0  Moving slowly or fidgety/restless 0 0 0 0 0  Suicidal thoughts 0 0 0 0 0  PHQ-9 Score 2 2 5 5 3   Difficult doing work/chores Not difficult at all Not  difficult at all Not difficult at all  Not difficult at all       12/26/2022    4:35 PM 10/14/2022    4:21 PM 04/20/2022    8:31 AM 10/15/2021   10:50 AM  GAD 7 : Generalized Anxiety Score  Nervous, Anxious, on Edge 0 0 0 0  Control/stop worrying 0 0 0 0  Worry too much - different things 0 0 0 0  Trouble relaxing 1 0 1 2  Restless 1 0 0 0  Easily annoyed or irritable 1 1 0 1  Afraid - awful might happen 0 0 0 0  Total GAD 7 Score 3 1 1 3   Anxiety Difficulty Not difficult at all Not difficult at all  Not difficult at all      01/08/2019    3:10 PM 01/29/2022    6:30 PM 04/20/2022    8:31 AM 10/14/2022    4:20 PM 12/26/2022    4:39 PM  Fall Risk  Falls in the past year? 0  0 0 0  Was there an injury with Fall?   0 0 0  Fall Risk Category Calculator   0 0 0  Fall Risk Category (Retired)   Low    (RETIRED) Patient Fall Risk  Level Low fall risk  Low fall risk    Patient at Risk for Falls Due to   No Fall Risks No Fall Risks No Fall Risks  Fall risk Follow up Falls evaluation completed  Falls evaluation completed  Education provided     Information is confidential and restricted. Go to Review Flowsheets to unlock data.    Past Medical History:  Past Medical History:  Diagnosis Date   GERD (gastroesophageal reflux disease)    Pyelonephritis 2002   history of in2002 pregnancy   Type O blood, Rh negative     Surgical History:  Past Surgical History:  Procedure Laterality Date   AUGMENTATION MAMMAPLASTY Bilateral 2011   saline   BREAST ENHANCEMENT SURGERY  2011    Medications:  Current Outpatient Medications on File Prior to Visit  Medication Sig   clobetasol ointment (TEMOVATE) 0.05 % Apply 1 application topically 2 (two) times daily.   escitalopram (LEXAPRO) 20 MG tablet TAKE 1 TABLET (20 MG) BY MOUTH DAILY.   ondansetron (ZOFRAN) 4 MG tablet Take 1 tablet (4 mg total) by mouth every 8 (eight) hours as needed for nausea or vomiting.   PREVIDENT 5000 BOOSTER PLUS 1.1 % PSTE  daily.    No current facility-administered medications on file prior to visit.    Allergies:  Allergies  Allergen Reactions   Penicillins Rash   Social History:  Social History   Socioeconomic History   Marital status: Married    Spouse name: Not on file   Number of children: Not on file   Years of education: Not on file   Highest education level: Not on file  Occupational History   Not on file  Tobacco Use   Smoking status: Never   Smokeless tobacco: Never  Vaping Use   Vaping Use: Never used  Substance and Sexual Activity   Alcohol use: No   Drug use: No   Sexual activity: Yes    Birth control/protection: None  Other Topics Concern   Not on file  Social History Narrative   Not on file   Social Determinants of Health   Financial Resource Strain: Not on file  Food Insecurity: Not on file  Transportation Needs: Not on file  Physical Activity: Not on file  Stress: Not on file  Social Connections: Not on file  Intimate Partner Violence: Not on file   Social History   Tobacco Use  Smoking Status Never  Smokeless Tobacco Never   Social History   Substance and Sexual Activity  Alcohol Use No    Family History:  Family History  Problem Relation Age of Onset   Diabetes Mother    Hypertension Mother    Pancreatitis Mother    Heart disease Maternal Grandfather    Diabetes Son        type 1   Cancer Neg Hx    Stroke Neg Hx    COPD Neg Hx    Breast cancer Neg Hx     Past medical history, surgical history, medications, allergies, family history and social history reviewed with patient today and changes made to appropriate areas of the chart.   ROS All other ROS negative except what is listed above and in the HPI.      Objective:    BP 127/80   Pulse 81   Temp 98 F (36.7 C) (Oral)   Ht 5' 4.02" (1.626 m)   Wt 191 lb (86.6 kg)   SpO2 97%   BMI 32.77 kg/m  Wt Readings from Last 3 Encounters:  12/26/22 191 lb (86.6 kg)  10/14/22 193 lb 4.8  oz (87.7 kg)  04/20/22 195 lb 3.2 oz (88.5 kg)    Physical Exam Vitals and nursing note reviewed.  Constitutional:      General: She is awake. She is not in acute distress.    Appearance: She is well-developed. She is not ill-appearing.  HENT:     Head: Normocephalic and atraumatic.     Right Ear: Hearing, tympanic membrane, ear canal and external ear normal. No drainage.     Left Ear: Hearing, tympanic membrane, ear canal and external ear normal. No drainage.     Nose: Nose normal.     Right Sinus: No maxillary sinus tenderness or frontal sinus tenderness.     Left Sinus: No maxillary sinus tenderness or frontal sinus tenderness.     Mouth/Throat:     Mouth: Mucous membranes are moist.     Pharynx: Oropharynx is clear. Uvula midline. No pharyngeal swelling, oropharyngeal exudate or posterior oropharyngeal erythema.  Eyes:     General: Lids are normal.        Right eye: No discharge.        Left eye: No discharge.     Extraocular Movements: Extraocular movements intact.     Conjunctiva/sclera: Conjunctivae normal.     Pupils: Pupils are equal, round, and reactive to light.     Visual Fields: Right eye visual fields normal and left eye visual fields normal.  Neck:     Thyroid: No thyromegaly.     Vascular: No carotid bruit.     Trachea: Trachea normal.  Cardiovascular:     Rate and Rhythm: Normal rate and regular rhythm.     Heart sounds: Normal heart sounds. No murmur heard.    No gallop.  Pulmonary:     Effort: Pulmonary effort is normal. No accessory muscle usage or respiratory distress.     Breath sounds: Normal breath sounds.  Abdominal:     General: Bowel sounds are normal.     Palpations: Abdomen is soft. There is no hepatomegaly or splenomegaly.     Tenderness: There is no abdominal tenderness.  Musculoskeletal:        General: Normal range of motion.     Cervical back: Normal range of motion and neck supple.     Right lower leg: No edema.     Left lower leg: No  edema.  Lymphadenopathy:     Head:     Right side of head: No submental, submandibular, tonsillar, preauricular or posterior auricular adenopathy.     Left side of head: No submental, submandibular, tonsillar, preauricular or posterior auricular adenopathy.     Cervical: No cervical adenopathy.  Skin:    General: Skin is warm and dry.     Capillary Refill: Capillary refill takes less than 2 seconds.     Findings: No rash.  Neurological:     Mental Status: She is alert and oriented to person, place, and time.     Gait: Gait is intact.     Deep Tendon Reflexes: Reflexes are normal and symmetric.     Reflex Scores:      Brachioradialis reflexes are 2+ on the right side and 2+ on the left side.      Patellar reflexes are 2+ on the right side and 2+ on the left side. Psychiatric:        Attention and Perception: Attention normal.  Mood and Affect: Mood normal.        Speech: Speech normal.        Behavior: Behavior normal. Behavior is cooperative.        Thought Content: Thought content normal.        Judgment: Judgment normal.     Results for orders placed or performed in visit on 10/15/21  CBC with Differential/Platelet  Result Value Ref Range   WBC 5.2 3.4 - 10.8 x10E3/uL   RBC 4.04 3.77 - 5.28 x10E6/uL   Hemoglobin 11.9 11.1 - 15.9 g/dL   Hematocrit 16.1 09.6 - 46.6 %   MCV 89 79 - 97 fL   MCH 29.5 26.6 - 33.0 pg   MCHC 33.1 31.5 - 35.7 g/dL   RDW 04.5 40.9 - 81.1 %   Platelets 346 150 - 450 x10E3/uL   Neutrophils 51 Not Estab. %   Lymphs 36 Not Estab. %   Monocytes 5 Not Estab. %   Eos 7 Not Estab. %   Basos 1 Not Estab. %   Neutrophils Absolute 2.7 1.4 - 7.0 x10E3/uL   Lymphocytes Absolute 1.9 0.7 - 3.1 x10E3/uL   Monocytes Absolute 0.3 0.1 - 0.9 x10E3/uL   EOS (ABSOLUTE) 0.3 0.0 - 0.4 x10E3/uL   Basophils Absolute 0.1 0.0 - 0.2 x10E3/uL   Immature Granulocytes 0 Not Estab. %   Immature Grans (Abs) 0.0 0.0 - 0.1 x10E3/uL  Comprehensive metabolic panel   Result Value Ref Range   Glucose 89 70 - 99 mg/dL   BUN 12 6 - 24 mg/dL   Creatinine, Ser 9.14 0.57 - 1.00 mg/dL   eGFR 782 >95 AO/ZHY/8.65   BUN/Creatinine Ratio 18 9 - 23   Sodium 142 134 - 144 mmol/L   Potassium 4.4 3.5 - 5.2 mmol/L   Chloride 106 96 - 106 mmol/L   CO2 24 20 - 29 mmol/L   Calcium 9.0 8.7 - 10.2 mg/dL   Total Protein 7.0 6.0 - 8.5 g/dL   Albumin 4.3 3.8 - 4.8 g/dL   Globulin, Total 2.7 1.5 - 4.5 g/dL   Albumin/Globulin Ratio 1.6 1.2 - 2.2   Bilirubin Total 0.5 0.0 - 1.2 mg/dL   Alkaline Phosphatase 97 44 - 121 IU/L   AST 20 0 - 40 IU/L   ALT 12 0 - 32 IU/L  Lipid Panel w/o Chol/HDL Ratio  Result Value Ref Range   Cholesterol, Total 193 100 - 199 mg/dL   Triglycerides 76 0 - 149 mg/dL   HDL 62 >78 mg/dL   VLDL Cholesterol Cal 14 5 - 40 mg/dL   LDL Chol Calc (NIH) 469 (H) 0 - 99 mg/dL  TSH  Result Value Ref Range   TSH 1.170 0.450 - 4.500 uIU/mL  VITAMIN D 25 Hydroxy (Vit-D Deficiency, Fractures)  Result Value Ref Range   Vit D, 25-Hydroxy 33.4 30.0 - 100.0 ng/mL  Hepatitis C antibody  Result Value Ref Range   Hep C Virus Ab Non Reactive Non Reactive  Iron Binding Cap (TIBC)([facility])  Result Value Ref Range   Total Iron Binding Capacity 322 250 - 450 ug/dL   UIBC 629 528 - 413 ug/dL   Iron 244 (H) 27 - 010 ug/dL   Iron Saturation 50 15 - 55 %  Ferritin  Result Value Ref Range   Ferritin 39 15 - 150 ng/mL  Cologuard  Result Value Ref Range   COLOGUARD Negative Negative      Assessment & Plan:   Problem List Items  Addressed This Visit       Other   Anxiety - Primary    Chronic, ongoing.  Denies SI/HI.  Continue current medication regimen and adjust as needed (she has self reduced to 10 MG and is tolerating, will call for refills).  Labs: CBC, CMP, TSH.      Relevant Orders   CBC with Differential/Platelet   TSH   Class 1 obesity due to excess calories without serious comorbidity with body mass index (BMI) of 34.0 to 34.9 in adult     BMI 32.77, seeing some loss. Tolerating Wegovy at this time, will plan on increase to 1.7 MG dosing - script sent.  Educated on this medication and risks/benefits.   No family history of thyroid cancer (MTC, MEN 2, thyroid cell tumors) or pancreatitis.  Recommended eating smaller high protein, low fat meals more frequently and exercising 30 mins a day 5 times a week with a goal of 10-15lb weight loss in the next 3 months. Patient voiced their understanding and motivation to adhere to these recommendations. Return in 3 months.       Relevant Medications   Semaglutide-Weight Management (WEGOVY) 1.7 MG/0.75ML SOAJ   Elevated low density lipoprotein (LDL) cholesterol level    Noted on past labs.  Recheck today.  Continue focus on healthy diet and regular exercise.      Relevant Orders   Comprehensive metabolic panel   Lipid Panel w/o Chol/HDL Ratio   Family history of diabetes mellitus    In mother, check A1c annually.      Relevant Orders   HgB A1c   Vitamin D deficiency    Chronic, ongoing.  Continue supplement at home daily and adjust as needed.  Labs today.      Relevant Orders   VITAMIN D 25 Hydroxy (Vit-D Deficiency, Fractures)   Other Visit Diagnoses     Need for Td vaccine       Tdap in office today, educated on this.   Relevant Orders   Tdap vaccine greater than or equal to 7yo IM (Completed)   Encounter for annual physical exam       Annual physical today with labs and health maintenance reviewed, discussed with patient.        Follow up plan: Return in about 3 months (around 03/28/2023) for WEIGHT CHECK -- ZOXWRU.   LABORATORY TESTING:  - Pap smear: up to date  IMMUNIZATIONS:   - Tdap: Tetanus vaccination status reviewed: Provided Tdap today - Influenza: Up to date - Pneumovax: Not applicable - Prevnar: Not applicable - COVID: Up to date - HPV: Not applicable - Shingrix vaccine: Not applicable  SCREENING: -Mammogram: Up to date  - Colonoscopy: Up To  Date -- cologuard - Bone Density: Not applicable  -Hearing Test: Not applicable  -Spirometry: Not applicable   PATIENT COUNSELING:   Advised to take 1 mg of folate supplement per day if capable of pregnancy.   Sexuality: Discussed sexually transmitted diseases, partner selection, use of condoms, avoidance of unintended pregnancy  and contraceptive alternatives.   Advised to avoid cigarette smoking.  I discussed with the patient that most people either abstain from alcohol or drink within safe limits (<=14/week and <=4 drinks/occasion for males, <=7/weeks and <= 3 drinks/occasion for females) and that the risk for alcohol disorders and other health effects rises proportionally with the number of drinks per week and how often a drinker exceeds daily limits.  Discussed cessation/primary prevention of drug use and availability of treatment for  abuse.   Diet: Encouraged to adjust caloric intake to maintain  or achieve ideal body weight, to reduce intake of dietary saturated fat and total fat, to limit sodium intake by avoiding high sodium foods and not adding table salt, and to maintain adequate dietary potassium and calcium preferably from fresh fruits, vegetables, and low-fat dairy products.    stressed the importance of regular exercise  Injury prevention: Discussed safety belts, safety helmets, smoke detector, smoking near bedding or upholstery.   Dental health: Discussed importance of regular tooth brushing, flossing, and dental visits.    NEXT PREVENTATIVE PHYSICAL DUE IN 1 YEAR. Return in about 3 months (around 03/28/2023) for WEIGHT CHECK -- ZOXWRU.

## 2022-12-26 NOTE — Assessment & Plan Note (Signed)
Chronic, ongoing.  Denies SI/HI.  Continue current medication regimen and adjust as needed (she has self reduced to 10 MG and is tolerating, will call for refills).  Labs: CBC, CMP, TSH.

## 2022-12-26 NOTE — Assessment & Plan Note (Signed)
Noted on past labs.  Recheck today.  Continue focus on healthy diet and regular exercise.

## 2022-12-26 NOTE — Assessment & Plan Note (Signed)
BMI 32.77, seeing some loss. Tolerating Wegovy at this time, will plan on increase to 1.7 MG dosing - script sent.  Educated on this medication and risks/benefits.   No family history of thyroid cancer (MTC, MEN 2, thyroid cell tumors) or pancreatitis.  Recommended eating smaller high protein, low fat meals more frequently and exercising 30 mins a day 5 times a week with a goal of 10-15lb weight loss in the next 3 months. Patient voiced their understanding and motivation to adhere to these recommendations. Return in 3 months.

## 2022-12-27 ENCOUNTER — Other Ambulatory Visit (HOSPITAL_COMMUNITY): Payer: Self-pay

## 2022-12-27 ENCOUNTER — Other Ambulatory Visit: Payer: Self-pay

## 2022-12-27 LAB — CBC WITH DIFFERENTIAL/PLATELET
Basophils Absolute: 0.1 10*3/uL (ref 0.0–0.2)
Basos: 1 %
EOS (ABSOLUTE): 0.2 10*3/uL (ref 0.0–0.4)
Eos: 3 %
Hematocrit: 35.5 % (ref 34.0–46.6)
Hemoglobin: 11.7 g/dL (ref 11.1–15.9)
Immature Grans (Abs): 0 10*3/uL (ref 0.0–0.1)
Immature Granulocytes: 0 %
Lymphocytes Absolute: 3.1 10*3/uL (ref 0.7–3.1)
Lymphs: 45 %
MCH: 30 pg (ref 26.6–33.0)
MCHC: 33 g/dL (ref 31.5–35.7)
MCV: 91 fL (ref 79–97)
Monocytes Absolute: 0.5 10*3/uL (ref 0.1–0.9)
Monocytes: 7 %
Neutrophils Absolute: 3 10*3/uL (ref 1.4–7.0)
Neutrophils: 44 %
Platelets: 383 10*3/uL (ref 150–450)
RBC: 3.9 x10E6/uL (ref 3.77–5.28)
RDW: 13 % (ref 11.7–15.4)
WBC: 6.8 10*3/uL (ref 3.4–10.8)

## 2022-12-27 LAB — COMPREHENSIVE METABOLIC PANEL
ALT: 18 IU/L (ref 0–32)
AST: 24 IU/L (ref 0–40)
Albumin/Globulin Ratio: 1.7 (ref 1.2–2.2)
Albumin: 4.6 g/dL (ref 3.9–4.9)
Alkaline Phosphatase: 91 IU/L (ref 44–121)
BUN/Creatinine Ratio: 12 (ref 9–23)
BUN: 9 mg/dL (ref 6–24)
Bilirubin Total: 0.4 mg/dL (ref 0.0–1.2)
CO2: 23 mmol/L (ref 20–29)
Calcium: 9.7 mg/dL (ref 8.7–10.2)
Chloride: 103 mmol/L (ref 96–106)
Creatinine, Ser: 0.75 mg/dL (ref 0.57–1.00)
Globulin, Total: 2.7 g/dL (ref 1.5–4.5)
Glucose: 88 mg/dL (ref 70–99)
Potassium: 4 mmol/L (ref 3.5–5.2)
Sodium: 140 mmol/L (ref 134–144)
Total Protein: 7.3 g/dL (ref 6.0–8.5)
eGFR: 98 mL/min/{1.73_m2} (ref >59)

## 2022-12-27 LAB — LIPID PANEL W/O CHOL/HDL RATIO
Cholesterol, Total: 188 mg/dL (ref 100–199)
HDL: 53 mg/dL (ref >39)
LDL Chol Calc (NIH): 111 mg/dL — ABNORMAL HIGH (ref 0–99)
Triglycerides: 137 mg/dL (ref 0–149)
VLDL Cholesterol Cal: 24 mg/dL (ref 5–40)

## 2022-12-27 LAB — HEMOGLOBIN A1C
Est. average glucose Bld gHb Est-mCnc: 108 mg/dL
Hgb A1c MFr Bld: 5.4 % (ref 4.8–5.6)

## 2022-12-27 LAB — TSH: TSH: 1.24 u[IU]/mL (ref 0.450–4.500)

## 2022-12-27 LAB — VITAMIN D 25 HYDROXY (VIT D DEFICIENCY, FRACTURES): Vit D, 25-Hydroxy: 40.6 ng/mL (ref 30.0–100.0)

## 2022-12-27 NOTE — Progress Notes (Signed)
Contacted via MyChart   Good afternoon Holly Powell, labs have returned and overall look great with exception of LDL.  Your LDL is above normal. The LDL is the bad cholesterol. Over time and in combination with inflammation and other factors, this contributes to plaque which in turn may lead to stroke and/or heart attack down the road. Sometimes high LDL is primarily genetic, and people might be eating all the right foods but still have high numbers. Other times, there is room for improvement in one's diet and eating healthier can bring this number down and potentially reduce one's risk of heart attack and/or stroke.   To reduce your LDL, Remember - more fruits and vegetables, more fish, and limit red meat and dairy products. More soy, nuts, beans, barley, lentils, oats and plant sterol ester enriched margarine instead of butter. I also encourage eliminating sugar and processed food. Remember, shop on the outside of the grocery store and visit your [employer]. If you would like to talk with me about dietary changes plus or minus medications for your cholesterol, please let me know. We should recheck your cholesterol in  6 to 12 months.  Any questions? Keep being amazing!!  Thank you for allowing me to participate in your care.  I appreciate you. Kindest regards, Holly Powell

## 2022-12-30 ENCOUNTER — Encounter: Payer: Self-pay | Admitting: Nurse Practitioner

## 2023-03-22 ENCOUNTER — Ambulatory Visit
Admission: RE | Admit: 2023-03-22 | Discharge: 2023-03-22 | Disposition: A | Discharge status: Home / Self Care | Payer: Commercial Managed Care - PPO | Source: Ambulatory Visit | Attending: Nurse Practitioner | Admitting: Nurse Practitioner

## 2023-03-22 DIAGNOSIS — Z1231 Encounter for screening mammogram for malignant neoplasm of breast: Secondary | ICD-10-CM | POA: Insufficient documentation

## 2023-03-24 NOTE — Progress Notes (Signed)
Contacted via MyChart   Normal mammogram, may repeat in one year:)

## 2023-03-28 ENCOUNTER — Ambulatory Visit: Payer: Commercial Managed Care - PPO | Admitting: Nurse Practitioner

## 2023-07-01 ENCOUNTER — Encounter: Payer: Self-pay | Admitting: Nurse Practitioner

## 2023-07-15 NOTE — Patient Instructions (Signed)

## 2023-07-17 ENCOUNTER — Encounter: Payer: Self-pay | Admitting: Nurse Practitioner

## 2023-07-17 ENCOUNTER — Telehealth (INDEPENDENT_AMBULATORY_CARE_PROVIDER_SITE_OTHER): Payer: Commercial Managed Care - PPO | Admitting: Nurse Practitioner

## 2023-07-17 ENCOUNTER — Telehealth: Payer: Commercial Managed Care - PPO | Admitting: Nurse Practitioner

## 2023-07-17 VITALS — BP 130/73 | HR 91 | Temp 98.1°F

## 2023-07-17 DIAGNOSIS — K130 Diseases of lips: Secondary | ICD-10-CM | POA: Diagnosis not present

## 2023-07-17 MED ORDER — CORTIBALM 1 % EX STCK: CUTANEOUS | 12 refills | Status: DC
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
## 2023-07-18 ENCOUNTER — Ambulatory Visit: Payer: Commercial Managed Care - PPO | Admitting: Nurse Practitioner

## 2023-07-18 ENCOUNTER — Telehealth: Payer: Commercial Managed Care - PPO | Admitting: Nurse Practitioner

## 2023-07-18 NOTE — Progress Notes (Signed)
Pt scheduled on 08/15/2023 @ 3:40 pm.

## 2023-08-15 ENCOUNTER — Telehealth: Payer: Commercial Managed Care - PPO | Admitting: Nurse Practitioner

## 2023-10-30 ENCOUNTER — Encounter: Payer: Self-pay | Admitting: Nurse Practitioner

## 2023-10-30 ENCOUNTER — Telehealth: Payer: Self-pay

## 2023-10-30 DIAGNOSIS — Z111 Encounter for screening for respiratory tuberculosis: Secondary | ICD-10-CM

## 2023-10-30 NOTE — Telephone Encounter (Signed)
 Called and spoke with patient. She states that she is needing a few vaccines and a quantiferon gold TB test for [employer]. States that she is unsure of exactly what she needs as she does not currently have the forms with her. Asked for patient to please take a picture of the forms when she can and send them to [place] as an attachment in a mychart message so we can see exactly what it is that she is needing. Patient verbalized understanding of this.

## 2023-10-30 NOTE — Telephone Encounter (Signed)
 Copied from CRM [id number]. Topic: Clinical - Medical Advice >> Oct 27, 2023 11:22 AM Holly Powell wrote: Reason for CRM:  Pt has several vaccines and TB test for school before April 30th.  She is asking that a nurse call her back.  CB#  [phone]

## 2023-10-31 NOTE — Telephone Encounter (Signed)
 Lab appt scheduled.

## 2023-11-03 ENCOUNTER — Other Ambulatory Visit

## 2023-11-07 ENCOUNTER — Other Ambulatory Visit

## 2023-11-07 DIAGNOSIS — Z111 Encounter for screening for respiratory tuberculosis: Secondary | ICD-10-CM

## 2023-11-11 LAB — QUANTIFERON-TB GOLD PLUS
QuantiFERON Mitogen Value: >10 [IU]/mL
QuantiFERON Nil Value: 0.51 [IU]/mL
QuantiFERON TB1 Ag Value: 0.2 [IU]/mL
QuantiFERON TB2 Ag Value: 0.2 [IU]/mL
QuantiFERON-TB Gold Plus: NEGATIVE

## 2023-11-13 ENCOUNTER — Encounter: Payer: Self-pay | Admitting: Nurse Practitioner

## 2023-12-05 ENCOUNTER — Ambulatory Visit

## 2023-12-24 NOTE — Patient Instructions (Signed)
Wegovy or Zepbound  Focus on DASH diet for high blood pressure or Mediterranean diet  Be Involved in Caring For Your Health:  Taking Medications When medications are taken as directed, they can greatly improve your health. But if they are not taken as prescribed, they may not work. In some cases, not taking them correctly can be harmful. To help ensure your treatment remains effective and safe, understand your medications and how to take them. Bring your medications to each visit for review by your provider.  Your lab results, notes, and after visit summary will be available on My Chart. We strongly encourage you to use this feature. If lab results are abnormal the clinic will contact you with the appropriate steps. If the clinic does not contact you assume the results are satisfactory. You can always view your results on My Chart. If you have questions regarding your health or results, please contact the clinic during office hours. You can also ask questions on My Chart.  We at [facility] are grateful that you chose us to provide your care. We strive to provide evidence-based and compassionate care and are always looking for feedback. If you get a survey from the clinic please complete this so we can hear your opinions.  Preventing High Cholesterol Cholesterol is a white, waxy substance similar to fat that the human body needs to help build cells. The liver makes all the cholesterol that a person's body needs. Having high cholesterol (hypercholesterolemia) increases your risk for heart disease and stroke. Extra or excess cholesterol comes from the food that you eat. High cholesterol can often be prevented with diet and lifestyle changes. If you already have high cholesterol, you can control it with diet, lifestyle changes, and medicines. How can high cholesterol affect me? If you have high cholesterol, fatty deposits (plaques) may build up on the walls of your blood vessels. The blood  vessels that carry blood away from your heart are called arteries. Plaques make the arteries narrower and stiffer. This in turn can: Restrict or block blood flow and cause blood clots to form. Increase your risk for heart attack and stroke. What can increase my risk for high cholesterol? This condition is more likely to develop in people who: Eat foods that are high in saturated fat or cholesterol. Saturated fat is mostly found in foods that come from animal sources. Are overweight. Are not getting enough exercise. Use products that contain nicotine or tobacco, such as cigarettes, e-cigarettes, and chewing tobacco. Have a family history of high cholesterol (familial hypercholesterolemia). What actions can I take to prevent this? Nutrition  Eat less saturated fat. Avoid trans fats (partially hydrogenated oils). These are often found in margarine and in some baked goods, fried foods, and snacks bought in packages. Avoid precooked or cured meat, such as bacon, sausages, or meat loaves. Avoid foods and drinks that have added sugars. Eat more fruits, vegetables, and whole grains. Choose healthy sources of protein, such as fish, poultry, lean cuts of red meat, beans, peas, lentils, and nuts. Choose healthy sources of fat, such as: Nuts. Vegetable oils, especially olive oil. Fish that have healthy fats, such as omega-3 fatty acids. These fish include mackerel or salmon. Lifestyle Lose weight if you are overweight. Maintaining a healthy body mass index (BMI) can help prevent or control high cholesterol. It can also lower your risk for diabetes and high blood pressure. Ask your health care provider to help you with a diet and exercise plan to lose   weight safely. Do not use any products that contain nicotine or tobacco. These products include cigarettes, chewing tobacco, and vaping devices, such as e-cigarettes. If you need help quitting, ask your health care provider. Alcohol use Do not drink  alcohol if: Your health care provider tells you not to drink. You are pregnant, may be pregnant, or are planning to become pregnant. If you drink alcohol: Limit how much you have to: 0-1 drink a day for women. 0-2 drinks a day for men. Know how much alcohol is in your drink. In the [place] one drink equals one 12 oz bottle of beer (355 mL), one 5 oz glass of wine (148 mL), or one 1 oz glass of hard liquor (44 mL). Activity  Get enough exercise. Do exercises as told by your health care provider. Each week, do at least 150 minutes of exercise that takes a medium level of effort (moderate-intensity exercise). This kind of exercise: Makes your heart beat faster while allowing you to still be able to talk. Can be done in short sessions several times a day or longer sessions a few times a week. For example, on 5 days each week, you could walk fast or ride your bike 3 times a day for 10 minutes each time. Medicines Your health care provider may recommend medicines to help lower cholesterol. This may be a medicine to lower the amount of cholesterol that your liver makes. You may need medicine if: Diet and lifestyle changes have not lowered your cholesterol enough. You have high cholesterol and other risk factors for heart disease or stroke. Take over-the-counter and prescription medicines only as told by your health care provider. General information Manage your risk factors for high cholesterol. Talk with your health care provider about all your risk factors and how to lower your risk. Manage other conditions that you have, such as diabetes or high blood pressure (hypertension). Have blood tests to check your cholesterol levels at regular points in time as told by your health care provider. Keep all follow-up visits. This is important. Where to find more information American Heart Association: www.heart.org National Heart, Lung, and Blood Institute: www.nhlbi.nih.gov Summary High cholesterol  increases your risk for heart disease and stroke. By keeping your cholesterol level low, you can reduce your risk for these conditions. High cholesterol can often be prevented with diet and lifestyle changes. Work with your health care provider to manage your risk factors, and have your blood tested regularly. This information is not intended to replace advice given to you by your health care provider. Make sure you discuss any questions you have with your health care provider. Document Revised: 02/25/2022 Document Reviewed: 09/28/2020 Elsevier Patient Education  2024 Elsevier Inc.  

## 2023-12-29 ENCOUNTER — Ambulatory Visit (INDEPENDENT_AMBULATORY_CARE_PROVIDER_SITE_OTHER): Admitting: Nurse Practitioner

## 2023-12-29 ENCOUNTER — Encounter: Payer: Self-pay | Admitting: Nurse Practitioner

## 2023-12-29 ENCOUNTER — Other Ambulatory Visit (HOSPITAL_COMMUNITY)
Admission: RE | Admit: 2023-12-29 | Discharge: 2023-12-29 | Disposition: A | Discharge status: Home / Self Care | Source: Ambulatory Visit | Attending: Nurse Practitioner | Admitting: Nurse Practitioner

## 2023-12-29 VITALS — BP 96/65 | HR 105 | Temp 97.7°F | Ht 67.8 in | Wt 175.8 lb

## 2023-12-29 DIAGNOSIS — E559 Vitamin D deficiency, unspecified: Secondary | ICD-10-CM

## 2023-12-29 DIAGNOSIS — E7800 Pure hypercholesterolemia, unspecified: Secondary | ICD-10-CM

## 2023-12-29 DIAGNOSIS — R000 Tachycardia, unspecified: Secondary | ICD-10-CM | POA: Diagnosis not present

## 2023-12-29 DIAGNOSIS — Z833 Family history of diabetes mellitus: Secondary | ICD-10-CM | POA: Diagnosis not present

## 2023-12-29 DIAGNOSIS — Z124 Encounter for screening for malignant neoplasm of cervix: Secondary | ICD-10-CM | POA: Insufficient documentation

## 2023-12-29 DIAGNOSIS — F419 Anxiety disorder, unspecified: Secondary | ICD-10-CM

## 2023-12-29 DIAGNOSIS — Z6826 Body mass index (BMI) 26.0-26.9, adult: Secondary | ICD-10-CM

## 2023-12-29 DIAGNOSIS — E66811 Obesity, class 1: Secondary | ICD-10-CM

## 2023-12-29 DIAGNOSIS — Z0000 Encounter for general adult medical examination without abnormal findings: Secondary | ICD-10-CM | POA: Diagnosis not present

## 2023-12-29 DIAGNOSIS — R5383 Other fatigue: Secondary | ICD-10-CM

## 2023-12-29 NOTE — Assessment & Plan Note (Signed)
In mother, check A1c annually.

## 2023-12-29 NOTE — Progress Notes (Signed)
 BP 96/65   Pulse (!) 105   Temp 97.7 F (36.5 C) (Oral)   Ht 5' 7.8" (1.722 m)   Wt 175 lb 12.8 oz (79.7 kg)   LMP 12/21/2023 (Exact Date)   BMI 26.89 kg/m    Subjective:    Patient ID: Holly Powell, female    DOB: 1975/11/06, 48 y.o.   MRN: [id number]  HPI: Holly Powell is a 48 y.o. female presenting on 12/29/2023 for comprehensive medical examination. Current medical complaints include:none  She currently lives with: significant other Menopausal Symptoms: no  Taking Mounjaro for weight loss, started mid-February. Reduced appetite with this. Gets prescribed via online.    The 10-year ASCVD risk score (Arnett DK, et al., 2019) is: 0.6%   Values used to calculate the score:     Age: 61 years     Sex: Female     Is Non-Hispanic African American: No     Diabetic: No     Tobacco smoker: No     Systolic Blood Pressure: 96 mmHg     Is BP treated: No     HDL Cholesterol: 53 mg/dL     Total Cholesterol: 188 mg/dL  ANXIETY & FATIGUE No current medications.    She is very fatigued.  Increased fatigue for past year, this has gotten worse over past few months.  Working day shift now and night shift once per week. Works two jobs, 44 hours total.  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 Goes to sleep 9 pm and is awake at 5 am.  On days off gets 12 hours sleep.  Continues to have regular menstrual cycles, 2 heavier days with bad cramping and then next day light.  No night sweats, hot flashes, brain fog.  Never has had a strong sex drive.  No vaginal dryness or discomfort with intercourse. Will have some stress incontinence if sneezes or coughs, but does not need to wear pads.  Does snore at night per her husband.  Her heart rate is elevated often at baseline recently -- in past was 60-80 range while resting.  This has been noticed over the past year, prior to Zepbound.  Does drink 2-4 Pepsi Max daily.  Having muscle pains at times, tight and achy to left vastus  lateralis.  No recent tick bites or exposures. Mood status: stable Satisfied with current treatment?: yes Symptom severity: mild  Duration of current treatment : chronic Medication compliance: good compliance Psychotherapy/counseling: yes in the past Previous psychiatric medications: Lexapro  Depressed mood: no Anxious mood: no Anhedonia: no Significant weight loss or gain: no Insomnia: none Fatigue: yes -- for months Feelings of worthlessness or guilt: no Impaired concentration/indecisiveness: no Suicidal ideations: no Hopelessness: no Crying spells: no    12/29/2023    1:18 PM 12/26/2022    4:35 PM 10/14/2022    4:20 PM 04/20/2022    8:31 AM 10/15/2021   10:50 AM  Depression screen PHQ 2/9  Decreased Interest 0 0 0 0 0  Down, Depressed, Hopeless 0 0 0 0 0  PHQ - 2 Score 0 0 0 0 0  Altered sleeping 0 0 0 1 0  Tired, decreased energy 3 0 0 1 2  Change in appetite 1 2 2 3 3   Feeling bad or failure about yourself  0 0 0 0 0  Trouble concentrating 0 0 0 0 0  Moving slowly or fidgety/restless 0 0 0 0 0  Suicidal thoughts 0  0 0 0 0  PHQ-9 Score 4 2 2 5 5   Difficult doing work/chores Not difficult at all Not difficult at all Not difficult at all Not difficult at all        12/29/2023    1:19 PM 12/26/2022    4:35 PM 10/14/2022    4:21 PM 04/20/2022    8:31 AM  GAD 7 : Generalized Anxiety Score  Nervous, Anxious, on Edge 0 0 0 0  Control/stop worrying 0 0 0 0  Worry too much - different things 0 0 0 0  Trouble relaxing 0 1 0 1  Restless 0 1 0 0  Easily annoyed or irritable 0 1 1 0  Afraid - awful might happen 0 0 0 0  Total GAD 7 Score 0 3 1 1   Anxiety Difficulty Not difficult at all Not difficult at all Not difficult at all       01/29/2022    6:30 PM 04/20/2022    8:31 AM 10/14/2022    4:20 PM 12/26/2022    4:39 PM 12/29/2023    1:18 PM  Fall Risk  Falls in the past year?  0 0 0 0  Was there an injury with Fall?  0 0 0 0  Fall Risk Category Calculator  0 0 0 0  Fall Risk  Category (Retired)  Low     (RETIRED) Patient Fall Risk Level  Low fall risk     Patient at Risk for Falls Due to  No Fall Risks No Fall Risks No Fall Risks No Fall Risks  Fall risk Follow up  Falls evaluation completed  Education provided Falls evaluation completed     Information is confidential and restricted. Go to Review Flowsheets to unlock data.    Past Medical History:  Past Medical History:  Diagnosis Date   GERD (gastroesophageal reflux disease)    Pyelonephritis 2002   history of in2002 pregnancy   Type O blood, Rh negative     Surgical History:  Past Surgical History:  Procedure Laterality Date   AUGMENTATION MAMMAPLASTY Bilateral 2011   saline   BREAST ENHANCEMENT SURGERY  2011    Medications:  Current Outpatient Medications on File Prior to Visit  Medication Sig   docusate sodium  (COLACE) 100 MG capsule Take 300 mg by mouth daily.   Multiple Vitamin (MULTIVITAMIN) tablet Take 1 tablet by mouth daily.   tirzepatide (MOUNJARO) 7.5 MG/0.5ML Pen Inject 7.5 mg into the skin once a week.   No current facility-administered medications on file prior to visit.    Allergies:  Allergies  Allergen Reactions   Penicillins Rash   Social History:  Social History   Socioeconomic History   Marital status: Married    Spouse name: Not on file   Number of children: Not on file   Years of education: Not on file   Highest education level: Not on file  Occupational History   Not on file  Tobacco Use   Smoking status: Never   Smokeless tobacco: Never  Vaping Use   Vaping status: Never Used  Substance and Sexual Activity   Alcohol use: No   Drug use: No   Sexual activity: Yes    Birth control/protection: None  Other Topics Concern   Not on file  Social History Narrative   Not on file   Social Drivers of Health   Financial Resource Strain: Low Risk  (12/29/2023)   Overall Financial Resource Strain (CARDIA)    Difficulty of Paying  Living Expenses: Not hard at  all  Food Insecurity: No Food Insecurity (12/29/2023)   Hunger Vital Sign    Worried About Running Out of Food in the Last Year: Never true    Ran Out of Food in the Last Year: Never true  Transportation Needs: No Transportation Needs (12/29/2023)   PRAPARE - Administrator, Civil Service (Medical): No    Lack of Transportation (Non-Medical): No  Physical Activity: Inactive (12/29/2023)   Exercise Vital Sign    Days of Exercise per Week: 0 days    Minutes of Exercise per Session: 0 min  Stress: No Stress Concern Present (12/29/2023)   [employer] of Occupational Health - Occupational Stress Questionnaire    Feeling of Stress : Not at all  Social Connections: Moderately Isolated (12/29/2023)   Social Connection and Isolation Panel [NHANES]    Frequency of Communication with Friends and Family: Three times a week    Frequency of Social Gatherings with Friends and Family: Never    Attends Religious Services: Never    Database administrator or Organizations: No    Attends Banker Meetings: Never    Marital Status: Married  Catering manager Violence: Not At Risk (12/29/2023)   Humiliation, Afraid, Rape, and Kick questionnaire    Fear of Current or Ex-Partner: No    Emotionally Abused: No    Physically Abused: No    Sexually Abused: No   Social History   Tobacco Use  Smoking Status Never  Smokeless Tobacco Never   Social History   Substance and Sexual Activity  Alcohol Use No    Family History:  Family History  Problem Relation Age of Onset   Diabetes Mother    Hypertension Mother    Pancreatitis Mother    Heart disease Maternal Grandfather    Diabetes Son        type 1   Cancer Neg Hx    Stroke Neg Hx    COPD Neg Hx    Breast cancer Neg Hx     Past medical history, surgical history, medications, allergies, family history and social history reviewed with patient today and changes made to appropriate areas of the chart.   ROS All other ROS  negative except what is listed above and in the HPI.      Objective:    BP 96/65   Pulse (!) 105   Temp 97.7 F (36.5 C) (Oral)   Ht 5' 7.8" (1.722 m)   Wt 175 lb 12.8 oz (79.7 kg)   LMP 12/21/2023 (Exact Date)   BMI 26.89 kg/m   Wt Readings from Last 3 Encounters:  12/29/23 175 lb 12.8 oz (79.7 kg)  12/26/22 191 lb (86.6 kg)  10/14/22 193 lb 4.8 oz (87.7 kg)    Physical Exam Vitals and nursing note reviewed. Exam conducted with a chaperone present.  Constitutional:      General: She is awake. She is not in acute distress.    Appearance: She is well-developed and well-groomed. She is not ill-appearing or toxic-appearing.  HENT:     Head: Normocephalic and atraumatic.     Right Ear: Hearing, tympanic membrane, ear canal and external ear normal. No drainage.     Left Ear: Hearing, tympanic membrane, ear canal and external ear normal. No drainage.     Nose: Nose normal.     Right Sinus: No maxillary sinus tenderness or frontal sinus tenderness.     Left Sinus: No maxillary sinus  tenderness or frontal sinus tenderness.     Mouth/Throat:     Mouth: Mucous membranes are moist.     Pharynx: Oropharynx is clear. Uvula midline. No pharyngeal swelling, oropharyngeal exudate or posterior oropharyngeal erythema.  Eyes:     General: Lids are normal.        Right eye: No discharge.        Left eye: No discharge.     Extraocular Movements: Extraocular movements intact.     Conjunctiva/sclera: Conjunctivae normal.     Pupils: Pupils are equal, round, and reactive to light.     Visual Fields: Right eye visual fields normal and left eye visual fields normal.  Neck:     Thyroid : No thyromegaly.     Vascular: No carotid bruit.     Trachea: Trachea normal.  Cardiovascular:     Rate and Rhythm: Regular rhythm. Tachycardia present.     Heart sounds: Normal heart sounds. No murmur heard.    No gallop.     Comments: HR 98 to 104 on auscultation, regular. Pulmonary:     Effort: Pulmonary  effort is normal. No accessory muscle usage or respiratory distress.     Breath sounds: Normal breath sounds.  Chest:  Breasts:    Right: Normal.     Left: Normal.  Abdominal:     General: Bowel sounds are normal.     Palpations: Abdomen is soft. There is no hepatomegaly or splenomegaly.     Tenderness: There is no abdominal tenderness.     Hernia: There is no hernia in the left inguinal area or right inguinal area.  Genitourinary:    Exam position: Lithotomy position.     Pubic Area: No rash.      Labia:        Right: No rash.        Left: No rash.      Urethra: No prolapse.     Vagina: Normal.     Cervix: Normal.     Uterus: Normal.      Adnexa: Right adnexa normal and left adnexa normal.     Comments: Cervix viewed, slightly posterior and tilted to patient right side.  Pap obtained and sent to lab. Musculoskeletal:        General: Normal range of motion.     Cervical back: Normal range of motion and neck supple.     Right lower leg: No edema.     Left lower leg: No edema.  Lymphadenopathy:     Head:     Right side of head: No submental, submandibular, tonsillar, preauricular or posterior auricular adenopathy.     Left side of head: No submental, submandibular, tonsillar, preauricular or posterior auricular adenopathy.     Cervical: No cervical adenopathy.     Upper Body:     Right upper body: No supraclavicular, axillary or pectoral adenopathy.     Left upper body: No supraclavicular, axillary or pectoral adenopathy.  Skin:    General: Skin is warm and dry.     Capillary Refill: Capillary refill takes less than 2 seconds.     Findings: No rash.  Neurological:     Mental Status: She is alert and oriented to person, place, and time.     Gait: Gait is intact.     Deep Tendon Reflexes: Reflexes are normal and symmetric.     Reflex Scores:      Brachioradialis reflexes are 2+ on the right side and 2+ on the left side.  Patellar reflexes are 2+ on the right side and 2+  on the left side. Psychiatric:        Attention and Perception: Attention normal.        Mood and Affect: Mood normal.        Speech: Speech normal.        Behavior: Behavior normal. Behavior is cooperative.        Thought Content: Thought content normal.        Judgment: Judgment normal.   EKG My review and personal interpretation at Time: 1400 Indication: tachycardia  Rate: 100  Rhythm: sinus Axis: normal Other: No nonspecific st abn, no stemi, no lvh  Results for orders placed or performed in visit on 11/07/23  QuantiFERON-TB Gold Plus   Collection Time: 11/07/23  4:08 PM  Result Value Ref Range   QuantiFERON Incubation Incubation performed.    QuantiFERON Criteria Comment    QuantiFERON TB1 Ag Value 0.20 IU/mL   QuantiFERON TB2 Ag Value 0.20 IU/mL   QuantiFERON Nil Value 0.51 IU/mL   QuantiFERON Mitogen Value >10.00 IU/mL   QuantiFERON-TB Gold Plus Negative Negative      Assessment & Plan:   Problem List Items Addressed This Visit       Other   Vitamin D  deficiency   Chronic, ongoing.  Continue supplement at home daily and adjust as needed.  Labs today.      Relevant Orders   VITAMIN D  25 Hydroxy (Vit-D Deficiency, Fractures)   Tachycardia   90 to 100 range on assessment.  She reports this has been present for one year, prior to starting Zepbound.  EKG overall reassuring with sinus tachycardia but no extra beats or abnormal findings.  Assess labs today and determine next steps once all labs return.  Recommend reduction of caffeine use.      Relevant Orders   EKG 12-Lead   Fatigue   Ongoing for one year.  Tachycardia present.  Will check CBC, CMP, TSH, Iron, B12, Ferritin, FSH/LH, A1c and CK due to occasional muscle pain.  Determine next steps after all labs return. - ?need for sleep study if labs all reassuring - ?related to hormonal shifts, may benefit hormone therapy if all labs and work-up reassuring.      Relevant Orders   FSH/LH   Iron   Ferritin    Vitamin B12   CK (Creatine Kinase)   Family history of diabetes mellitus   In mother, check A1c annually.      Relevant Orders   HgB A1c   Elevated low density lipoprotein (LDL) cholesterol level   Noted on past labs.  Recheck today.  Continue focus on healthy diet and regular exercise.      Relevant Orders   Comprehensive metabolic panel with GFR   Lipid Panel w/o Chol/HDL Ratio   BMI 26.0-26.9,adult   BMI 26.89, taking Zepbound via online.  Continue this and will check all labs today.  She reports elevation in heart rate was present prior to starting this.  Recommended eating smaller high protein, low fat meals more frequently and exercising 30 mins a day 5 times a week with a goal of 10-15lb weight loss in the next 3 months. Patient voiced their understanding and motivation to adhere to these recommendations.       Relevant Orders   TSH   Anxiety - Primary   Chronic, improved.  Denies SI/HI.  No medication use in some time.  Labs: CBC, CMP, TSH.  Other Visit Diagnoses       Cervical cancer screening       Pap obtained in office today and sent to lab.   Relevant Orders   Cytology - PAP     Encounter for annual physical exam       Annual physical today with labs and health maintenance reviewed, discussed with patient.   Relevant Orders   CBC with Differential/Platelet        Follow up plan: Return in about 4 weeks (around 01/26/2024) for Fatigue & Tachycardia.   LABORATORY TESTING:  - Pap smear: up to date  IMMUNIZATIONS:   - Tdap: Tetanus vaccination status reviewed: Up to Date - Influenza: Up to date - Pneumovax: Not applicable - Prevnar: Not applicable - COVID: Up to date - HPV: Not applicable - Shingrix vaccine: Not applicable  SCREENING: -Mammogram: Up to date  - Colonoscopy: Up To Date -- Cologuard - Bone Density: Not applicable  -Hearing Test: Not applicable  -Spirometry: Not applicable   PATIENT COUNSELING:   Advised to take 1 mg of folate  supplement per day if capable of pregnancy.   Sexuality: Discussed sexually transmitted diseases, partner selection, use of condoms, avoidance of unintended pregnancy  and contraceptive alternatives.   Advised to avoid cigarette smoking.  I discussed with the patient that most people either abstain from alcohol or drink within safe limits (<=14/week and <=4 drinks/occasion for males, <=7/weeks and <= 3 drinks/occasion for females) and that the risk for alcohol disorders and other health effects rises proportionally with the number of drinks per week and how often a drinker exceeds daily limits.  Discussed cessation/primary prevention of drug use and availability of treatment for abuse.   Diet: Encouraged to adjust caloric intake to maintain  or achieve ideal body weight, to reduce intake of dietary saturated fat and total fat, to limit sodium intake by avoiding high sodium foods and not adding table salt, and to maintain adequate dietary potassium and calcium preferably from fresh fruits, vegetables, and low-fat dairy products.    stressed the importance of regular exercise  Injury prevention: Discussed safety belts, safety helmets, smoke detector, smoking near bedding or upholstery.   Dental health: Discussed importance of regular tooth brushing, flossing, and dental visits.    NEXT PREVENTATIVE PHYSICAL DUE IN 1 YEAR. Return in about 4 weeks (around 01/26/2024) for Fatigue & Tachycardia.

## 2023-12-29 NOTE — Assessment & Plan Note (Signed)
Noted on past labs.  Recheck today.  Continue focus on healthy diet and regular exercise.

## 2023-12-29 NOTE — Assessment & Plan Note (Signed)
 Chronic, improved.  Denies SI/HI.  No medication use in some time.  Labs: CBC, CMP, TSH.

## 2023-12-29 NOTE — Assessment & Plan Note (Signed)
 BMI 26.89, taking Zepbound via online.  Continue this and will check all labs today.  She reports elevation in heart rate was present prior to starting this.  Recommended eating smaller high protein, low fat meals more frequently and exercising 30 mins a day 5 times a week with a goal of 10-15lb weight loss in the next 3 months. Patient voiced their understanding and motivation to adhere to these recommendations.

## 2023-12-29 NOTE — Assessment & Plan Note (Signed)
 Ongoing for one year.  Tachycardia present.  Will check CBC, CMP, TSH, Iron, B12, Ferritin, FSH/LH, A1c and CK due to occasional muscle pain.  Determine next steps after all labs return. - ?need for sleep study if labs all reassuring - ?related to hormonal shifts, may benefit hormone therapy if all labs and work-up reassuring.

## 2023-12-29 NOTE — Assessment & Plan Note (Signed)
Chronic, ongoing.  Continue supplement at home daily and adjust as needed.  Labs today.

## 2023-12-29 NOTE — Assessment & Plan Note (Signed)
 90 to 100 range on assessment.  She reports this has been present for one year, prior to starting Zepbound.  EKG overall reassuring with sinus tachycardia but no extra beats or abnormal findings.  Assess labs today and determine next steps once all labs return.  Recommend reduction of caffeine use.

## 2023-12-30 LAB — COMPREHENSIVE METABOLIC PANEL WITH GFR
ALT: 12 IU/L (ref 0–32)
AST: 17 IU/L (ref 0–40)
Albumin: 4.8 g/dL (ref 3.9–4.9)
Alkaline Phosphatase: 86 IU/L (ref 44–121)
BUN/Creatinine Ratio: 20 (ref 9–23)
BUN: 18 mg/dL (ref 6–24)
Bilirubin Total: 0.4 mg/dL (ref 0.0–1.2)
CO2: 23 mmol/L (ref 20–29)
Calcium: 10 mg/dL (ref 8.7–10.2)
Chloride: 98 mmol/L (ref 96–106)
Creatinine, Ser: 0.89 mg/dL (ref 0.57–1.00)
Globulin, Total: 3 g/dL (ref 1.5–4.5)
Glucose: 78 mg/dL (ref 70–99)
Potassium: 4.3 mmol/L (ref 3.5–5.2)
Sodium: 138 mmol/L (ref 134–144)
Total Protein: 7.8 g/dL (ref 6.0–8.5)
eGFR: 79 mL/min/{1.73_m2} (ref >59)

## 2023-12-30 LAB — FSH/LH
FSH: 6 m[IU]/mL
LH: 5.9 m[IU]/mL

## 2023-12-30 LAB — CK: Total CK: 55 U/L (ref 32–182)

## 2023-12-30 LAB — CBC WITH DIFFERENTIAL/PLATELET
Basophils Absolute: 0 10*3/uL (ref 0.0–0.2)
Basos: 1 %
EOS (ABSOLUTE): 0.2 10*3/uL (ref 0.0–0.4)
Eos: 2 %
Hematocrit: 42.7 % (ref 34.0–46.6)
Hemoglobin: 13.9 g/dL (ref 11.1–15.9)
Immature Grans (Abs): 0 10*3/uL (ref 0.0–0.1)
Immature Granulocytes: 0 %
Lymphocytes Absolute: 2.5 10*3/uL (ref 0.7–3.1)
Lymphs: 29 %
MCH: 30.3 pg (ref 26.6–33.0)
MCHC: 32.6 g/dL (ref 31.5–35.7)
MCV: 93 fL (ref 79–97)
Monocytes Absolute: 0.5 10*3/uL (ref 0.1–0.9)
Monocytes: 6 %
Neutrophils Absolute: 5.4 10*3/uL (ref 1.4–7.0)
Neutrophils: 62 %
Platelets: 411 10*3/uL (ref 150–450)
RBC: 4.59 x10E6/uL (ref 3.77–5.28)
RDW: 12.9 % (ref 11.7–15.4)
WBC: 8.6 10*3/uL (ref 3.4–10.8)

## 2023-12-30 LAB — LIPID PANEL W/O CHOL/HDL RATIO
Cholesterol, Total: 189 mg/dL (ref 100–199)
HDL: 49 mg/dL (ref >39)
LDL Chol Calc (NIH): 122 mg/dL — ABNORMAL HIGH (ref 0–99)
Triglycerides: 98 mg/dL (ref 0–149)
VLDL Cholesterol Cal: 18 mg/dL (ref 5–40)

## 2023-12-30 LAB — HEMOGLOBIN A1C
Est. average glucose Bld gHb Est-mCnc: 108 mg/dL
Hgb A1c MFr Bld: 5.4 % (ref 4.8–5.6)

## 2023-12-30 LAB — FERRITIN: Ferritin: 80 ng/mL (ref 15–150)

## 2023-12-30 LAB — VITAMIN B12: Vitamin B-12: 931 pg/mL (ref 232–1245)

## 2023-12-30 LAB — IRON: Iron: 93 ug/dL (ref 27–159)

## 2023-12-30 LAB — TSH: TSH: 1.22 u[IU]/mL (ref 0.450–4.500)

## 2023-12-30 LAB — VITAMIN D 25 HYDROXY (VIT D DEFICIENCY, FRACTURES): Vit D, 25-Hydroxy: 61.2 ng/mL (ref 30.0–100.0)

## 2023-12-31 ENCOUNTER — Ambulatory Visit: Payer: Self-pay | Admitting: Nurse Practitioner

## 2024-01-04 LAB — CYTOLOGY - PAP
Comment: NEGATIVE
Diagnosis: NEGATIVE
High risk HPV: NEGATIVE

## 2024-01-04 NOTE — Progress Notes (Signed)
 Contacted via MyChart   Good afternoon Holly Powell , your pap has returned and is nice and normal. Repeat in 5 years:)

## 2024-01-20 NOTE — Patient Instructions (Signed)
 Fatigue If you have fatigue, you feel tired all the time and have a lack of energy or a lack of motivation. Fatigue may make it difficult to start or complete tasks because of exhaustion. Occasional or mild fatigue is often a normal response to activity or life. However, long-term (chronic) or extreme fatigue may be a symptom of a medical condition such as: Depression. Not having enough red blood cells or hemoglobin in the blood (anemia). A problem with a small gland located in the lower front part of the neck (thyroid disorder). Rheumatologic conditions. These are problems related to the body's defense system (immune system). Infections, especially certain viral infections. Fatigue can also lead to negative health outcomes over time. Follow these instructions at home: Medicines Take over-the-counter and prescription medicines only as told by your health care provider. Take a multivitamin if told by your health care provider. Do not use herbal or dietary supplements unless they are approved by your health care provider. Eating and drinking  Avoid heavy meals in the evening. Eat a well-balanced diet, which includes lean proteins, whole grains, plenty of fruits and vegetables, and low-fat dairy products. Avoid eating or drinking too many products with caffeine in them. Avoid alcohol. Drink enough fluid to keep your urine pale yellow. Activity  Exercise regularly, as told by your health care provider. Use or practice techniques to help you relax, such as yoga, tai chi, meditation, or massage therapy. Lifestyle Change situations that cause you stress. Try to keep your work and personal schedules in balance. Do not use recreational or illegal drugs. General instructions Monitor your fatigue for any changes. Go to bed and get up at the same time every day. Avoid fatigue by pacing yourself during the day and getting enough sleep at night. Maintain a healthy weight. Contact a health care  provider if: Your fatigue does not get better. You have a fever. You suddenly lose or gain weight. You have headaches. You have trouble falling asleep or sleeping through the night. You feel angry, guilty, anxious, or sad. You have swelling in your legs or another part of your body. Get help right away if: You feel confused, feel like you might faint, or faint. Your vision is blurry or you have a severe headache. You have severe pain in your abdomen, your back, or the area between your waist and hips (pelvis). You have chest pain, shortness of breath, or an irregular or fast heartbeat. You are unable to urinate, or you urinate less than normal. You have abnormal bleeding from the rectum, nose, lungs, nipples, or, if you are female, the vagina. You vomit blood. You have thoughts about hurting yourself or others. These symptoms may be an emergency. Get help right away. Call 911. Do not wait to see if the symptoms will go away. Do not drive yourself to the hospital. Get help right away if you feel like you may hurt yourself or others, or have thoughts about taking your own life. Go to your nearest emergency room or: Call 911. Call the National Suicide Prevention Lifeline at (262)721-8699 or 988. This is open 24 hours a day. Text the Crisis Text Line at 8450584327. Summary If you have fatigue, you feel tired all the time and have a lack of energy or a lack of motivation. Fatigue may make it difficult to start or complete tasks because of exhaustion. Long-term (chronic) or extreme fatigue may be a symptom of a medical condition. Exercise regularly, as told by your health care provider.  Change situations that cause you stress. Try to keep your work and personal schedules in balance. This information is not intended to replace advice given to you by your health care provider. Make sure you discuss any questions you have with your health care provider. Document Revised: 05/17/2021 Document  Reviewed: 05/17/2021 Elsevier Patient Education  2024 [employer].

## 2024-01-26 ENCOUNTER — Ambulatory Visit: Admitting: Nurse Practitioner

## 2024-01-26 ENCOUNTER — Encounter: Payer: Self-pay | Admitting: Nurse Practitioner

## 2024-01-26 VITALS — BP 104/70 | HR 91 | Temp 97.9°F | Ht 67.8 in | Wt 170.4 lb

## 2024-01-26 DIAGNOSIS — R5383 Other fatigue: Secondary | ICD-10-CM

## 2024-01-26 NOTE — Progress Notes (Signed)
 BP 104/70   Pulse 91   Temp 97.9 F (36.6 C) (Oral)   Ht 5' 7.8 (1.722 m)   Wt 170 lb 6.4 oz (77.3 kg)   LMP 12/21/2023 (Exact Date)   SpO2 98%   BMI 26.06 kg/m    Subjective:    Patient ID: Holly Powell, female    DOB: [birth year], 48 y.o.   MRN: [id number]  HPI: Holly Powell is a 48 y.o. female  Chief Complaint  Patient presents with   Fatigue    4 week f/up- patient states she is not feeling as fatigued and tired as she was at her last visit   FATIGUE Follow-up for fatigue. Overall all recent labs were reassuring. Feeling a little bit better.   Duration:  months Severity: 4/10  Onset: gradual Context when symptoms started:  unknown Symptoms improve with rest: sometimes  Depressive symptoms: no Stress/anxiety: no Insomnia: none Snoring: husband says so Observed apnea by bed partner: no Daytime hypersomnolence:no Wakes feeling refreshed: no History of sleep study: no Dysnea on exertion:  no Orthopnea/PND: no Chest pain: no Chronic cough: no Lower extremity edema: no Arthralgias:no Myalgias: no Weakness: no Rash: no     01/26/2024    1:06 PM 12/29/2023    1:18 PM 12/26/2022    4:35 PM 10/14/2022    4:20 PM 04/20/2022    8:31 AM  Depression screen PHQ 2/9  Decreased Interest 0 0 0 0 0  Down, Depressed, Hopeless 0 0 0 0 0  PHQ - 2 Score 0 0 0 0 0  Altered sleeping 0 0 0 0 1  Tired, decreased energy 2 3 0 0 1  Change in appetite 1 1 2 2 3   Feeling bad or failure about yourself  0 0 0 0 0  Trouble concentrating 0 0 0 0 0  Moving slowly or fidgety/restless 0 0 0 0 0  Suicidal thoughts 0 0 0 0 0  PHQ-9 Score 3 4 2 2 5   Difficult doing work/chores Not difficult at all Not difficult at all Not difficult at all Not difficult at all Not difficult at all       01/26/2024    1:07 PM 12/29/2023    1:19 PM 12/26/2022    4:35 PM 10/14/2022    4:21 PM  GAD 7 : Generalized Anxiety Score  Nervous, Anxious, on Edge 0 0 0 0  Control/stop worrying 0 0 0 0  Worry too  much - different things 0 0 0 0  Trouble relaxing 0 0 1 0  Restless 0 0 1 0  Easily annoyed or irritable 1 0 1 1  Afraid - awful might happen 0 0 0 0  Total GAD 7 Score 1 0 3 1  Anxiety Difficulty Not difficult at all Not difficult at all Not difficult at all Not difficult at all   Relevant past medical, surgical, family and social history reviewed and updated as indicated. Interim medical history since our last visit reviewed. Allergies and medications reviewed and updated.  Review of Systems  Constitutional:  Negative for activity change, appetite change, diaphoresis, fatigue and fever.  Respiratory:  Negative for cough, chest tightness and shortness of breath.   Cardiovascular:  Negative for chest pain, palpitations and leg swelling.  Gastrointestinal: Negative.   Neurological:  Negative for dizziness, syncope, weakness, light-headedness, numbness and headaches.  Psychiatric/Behavioral: Negative.      Per HPI unless specifically indicated above     Objective:  BP 104/70   Pulse 91   Temp 97.9 F (36.6 C) (Oral)   Ht 5' 7.8 (1.722 m)   Wt 170 lb 6.4 oz (77.3 kg)   LMP 12/21/2023 (Exact Date)   SpO2 98%   BMI 26.06 kg/m   Wt Readings from Last 3 Encounters:  01/26/24 170 lb 6.4 oz (77.3 kg)  12/29/23 175 lb 12.8 oz (79.7 kg)  12/26/22 191 lb (86.6 kg)    Physical Exam Vitals and nursing note reviewed.  Constitutional:      General: She is awake. She is not in acute distress.    Appearance: She is well-developed and well-groomed. She is not ill-appearing or toxic-appearing.  HENT:     Head: Normocephalic.     Right Ear: Hearing and external ear normal.     Left Ear: Hearing and external ear normal.   Eyes:     General: Lids are normal.        Right eye: No discharge.        Left eye: No discharge.     Conjunctiva/sclera: Conjunctivae normal.     Pupils: Pupils are equal, round, and reactive to light.   Neck:     Thyroid : No thyromegaly.     Vascular: No  carotid bruit.   Cardiovascular:     Rate and Rhythm: Normal rate and regular rhythm.     Heart sounds: Normal heart sounds. No murmur heard.    No gallop.  Pulmonary:     Effort: Pulmonary effort is normal. No accessory muscle usage or respiratory distress.     Breath sounds: Normal breath sounds.  Abdominal:     General: Bowel sounds are normal. There is no distension.     Palpations: Abdomen is soft.     Tenderness: There is no abdominal tenderness.   Musculoskeletal:     Cervical back: Normal range of motion and neck supple.     Right lower leg: No edema.     Left lower leg: No edema.  Lymphadenopathy:     Cervical: No cervical adenopathy.   Skin:    General: Skin is warm and dry.   Neurological:     Mental Status: She is alert and oriented to person, place, and time.     Deep Tendon Reflexes: Reflexes are normal and symmetric.     Reflex Scores:      Brachioradialis reflexes are 2+ on the right side and 2+ on the left side.      Patellar reflexes are 2+ on the right side and 2+ on the left side.  Psychiatric:        Attention and Perception: Attention normal.        Mood and Affect: Mood normal.        Speech: Speech normal.        Behavior: Behavior normal. Behavior is cooperative.        Thought Content: Thought content normal.     Results for orders placed or performed in visit on 12/29/23  Cytology - PAP   Collection Time: 12/29/23  1:15 PM  Result Value Ref Range   High risk HPV Negative    Adequacy      Satisfactory for evaluation; transformation zone component PRESENT.   Diagnosis      - Negative for intraepithelial lesion or malignancy (NILM)   Comment Normal Reference Range HPV - Negative   CBC with Differential/Platelet   Collection Time: 12/29/23  2:08 PM  Result Value Ref Range  WBC 8.6 3.4 - 10.8 x10E3/uL   RBC 4.59 3.77 - 5.28 x10E6/uL   Hemoglobin 13.9 11.1 - 15.9 g/dL   Hematocrit 40.9 81.1 - 46.6 %   MCV 93 79 - 97 fL   MCH 30.3 26.6 -  33.0 pg   MCHC 32.6 31.5 - 35.7 g/dL   RDW 91.4 78.2 - 95.6 %   Platelets 411 150 - 450 x10E3/uL   Neutrophils 62 Not Estab. %   Lymphs 29 Not Estab. %   Monocytes 6 Not Estab. %   Eos 2 Not Estab. %   Basos 1 Not Estab. %   Neutrophils Absolute 5.4 1.4 - 7.0 x10E3/uL   Lymphocytes Absolute 2.5 0.7 - 3.1 x10E3/uL   Monocytes Absolute 0.5 0.1 - 0.9 x10E3/uL   EOS (ABSOLUTE) 0.2 0.0 - 0.4 x10E3/uL   Basophils Absolute 0.0 0.0 - 0.2 x10E3/uL   Immature Granulocytes 0 Not Estab. %   Immature Grans (Abs) 0.0 0.0 - 0.1 x10E3/uL  Comprehensive metabolic panel with GFR   Collection Time: 12/29/23  2:08 PM  Result Value Ref Range   Glucose 78 70 - 99 mg/dL   BUN 18 6 - 24 mg/dL   Creatinine, Ser 2.13 0.57 - 1.00 mg/dL   eGFR 79 >08 MV/HQI/6.96   BUN/Creatinine Ratio 20 9 - 23   Sodium 138 134 - 144 mmol/L   Potassium 4.3 3.5 - 5.2 mmol/L   Chloride 98 96 - 106 mmol/L   CO2 23 20 - 29 mmol/L   Calcium 10.0 8.7 - 10.2 mg/dL   Total Protein 7.8 6.0 - 8.5 g/dL   Albumin 4.8 3.9 - 4.9 g/dL   Globulin, Total 3.0 1.5 - 4.5 g/dL   Bilirubin Total 0.4 0.0 - 1.2 mg/dL   Alkaline Phosphatase 86 44 - 121 IU/L   AST 17 0 - 40 IU/L   ALT 12 0 - 32 IU/L  Lipid Panel w/o Chol/HDL Ratio   Collection Time: 12/29/23  2:08 PM  Result Value Ref Range   Cholesterol, Total 189 100 - 199 mg/dL   Triglycerides 98 0 - 149 mg/dL   HDL 49 >29 mg/dL   VLDL Cholesterol Cal 18 5 - 40 mg/dL   LDL Chol Calc (NIH) 528 (H) 0 - 99 mg/dL  TSH   Collection Time: 12/29/23  2:08 PM  Result Value Ref Range   TSH 1.220 0.450 - 4.500 uIU/mL  HgB A1c   Collection Time: 12/29/23  2:08 PM  Result Value Ref Range   Hgb A1c MFr Bld 5.4 4.8 - 5.6 %   Est. average glucose Bld gHb Est-mCnc 108 mg/dL  VITAMIN D  25 Hydroxy (Vit-D Deficiency, Fractures)   Collection Time: 12/29/23  2:08 PM  Result Value Ref Range   Vit D, 25-Hydroxy 61.2 30.0 - 100.0 ng/mL  FSH/LH   Collection Time: 12/29/23  2:08 PM  Result Value Ref  Range   LH 5.9 mIU/mL   FSH 6.0 mIU/mL  Iron   Collection Time: 12/29/23  2:08 PM  Result Value Ref Range   Iron 93 27 - 159 ug/dL  Ferritin   Collection Time: 12/29/23  2:08 PM  Result Value Ref Range   Ferritin 80 15 - 150 ng/mL  Vitamin B12   Collection Time: 12/29/23  2:08 PM  Result Value Ref Range   Vitamin B-12 931 232 - 1,245 pg/mL  CK (Creatine Kinase)   Collection Time: 12/29/23  2:08 PM  Result Value Ref Range   Total CK  55 32 - 182 U/L      Assessment & Plan:   Problem List Items Addressed This Visit       Other   Fatigue - Primary   Ongoing for one year, but improving at this time. Continue to monitor closely, all recent labs reassuring. - ?need for sleep study in future if ongoing fatigue - ?related to hormonal shifts, may benefit hormone therapy or other treatment in future if symptoms worsen.        Follow up plan: Return in about 9 months (around 10/29/2024) for Annual Physical -- after 12/28/24.

## 2024-01-26 NOTE — Assessment & Plan Note (Signed)
 Ongoing for one year, but improving at this time. Continue to monitor closely, all recent labs reassuring. - ?need for sleep study in future if ongoing fatigue - ?related to hormonal shifts, may benefit hormone therapy or other treatment in future if symptoms worsen.

## 2024-04-21 IMAGING — MG DIGITAL SCREENING BREAST BILAT IMPLANT W/ TOMO W/ CAD
8 of 12 series · 8 of 28 positions shown · non-contrast
Comparison: Previous exam(s).

CLINICAL DATA: Screening.

EXAM:
DIGITAL SCREENING BILATERAL MAMMOGRAM WITH IMPLANTS, CAD AND
TOMOSYNTHESIS
TECHNIQUE: Bilateral screening digital craniocaudal and mediolateral oblique
mammograms were obtained. Bilateral screening digital breast
tomosynthesis was performed. The images were evaluated with
computer-aided detection. Standard and/or implant displaced views
were performed.

[L MLO]
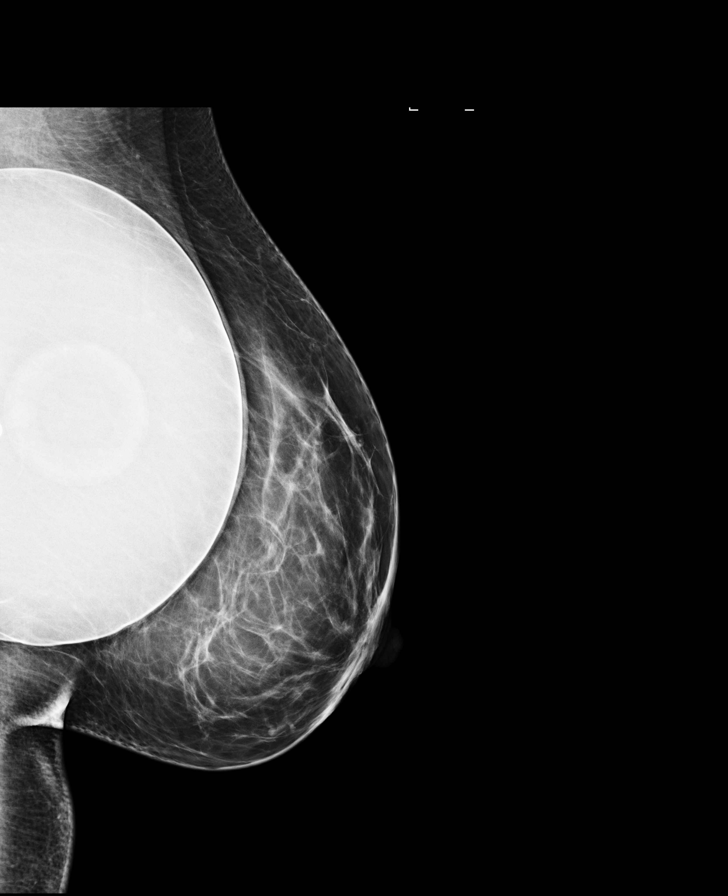

[R MLO]
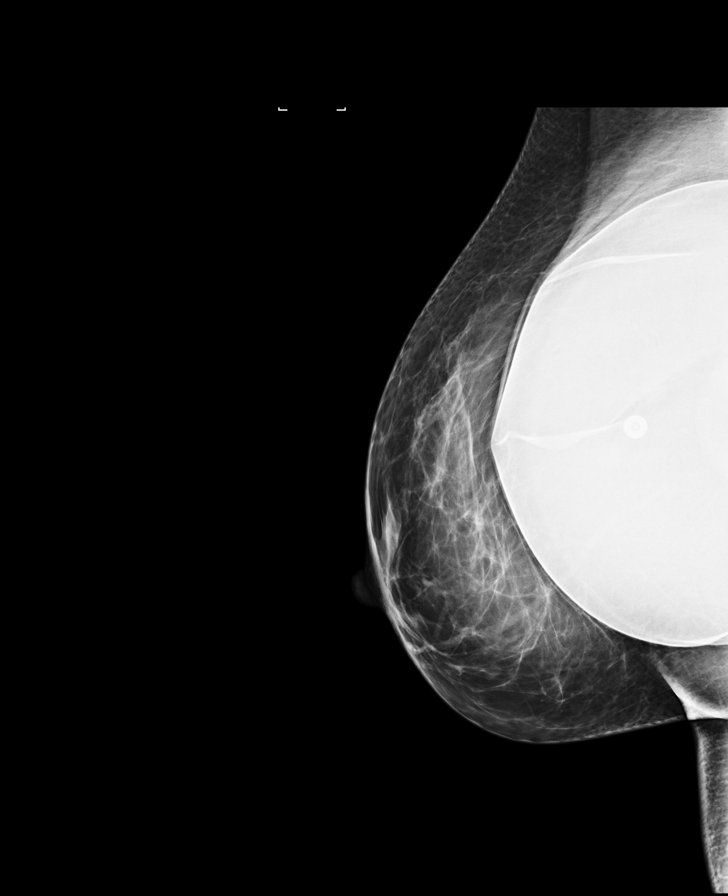

[L CC]
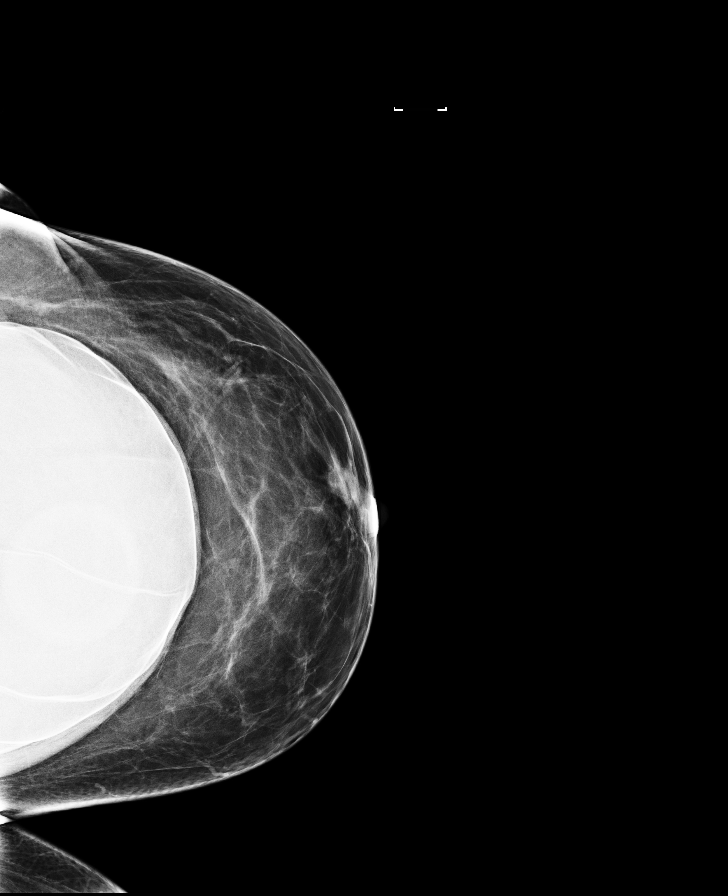

[R CC]
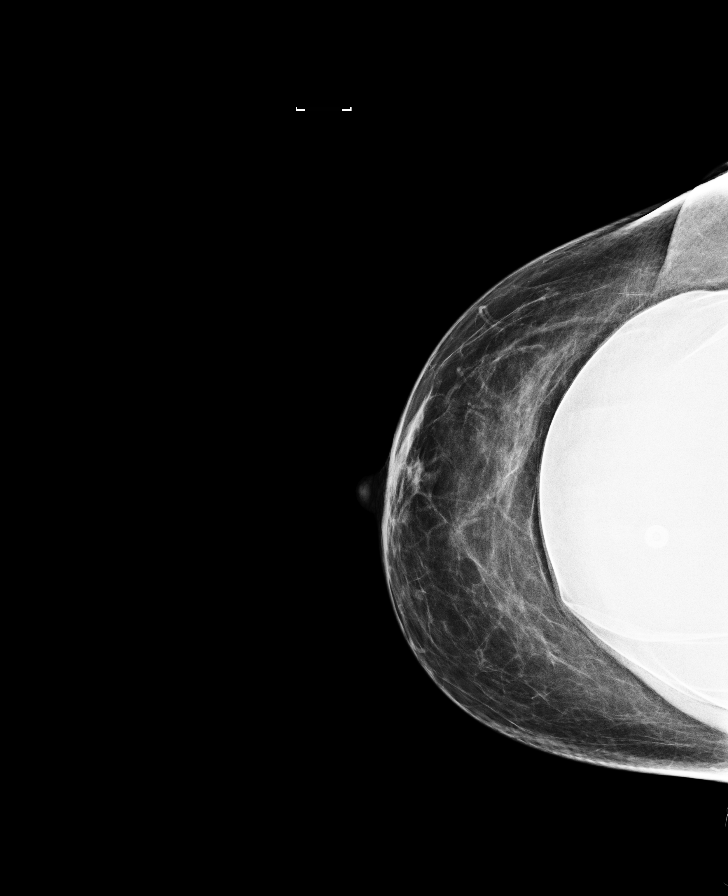

[L CC synth-2D]
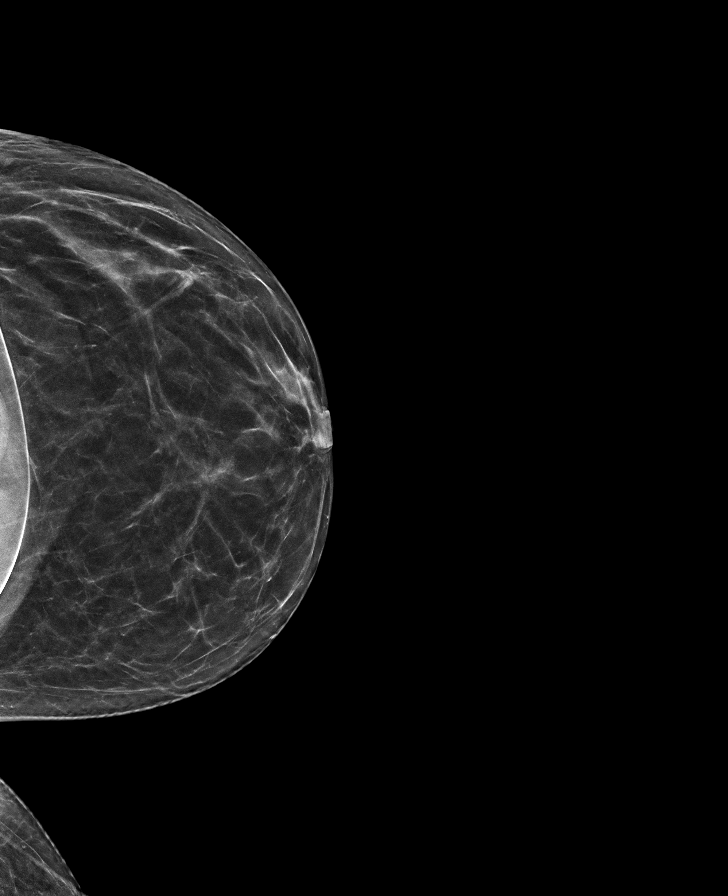

[R MLO synth-2D]
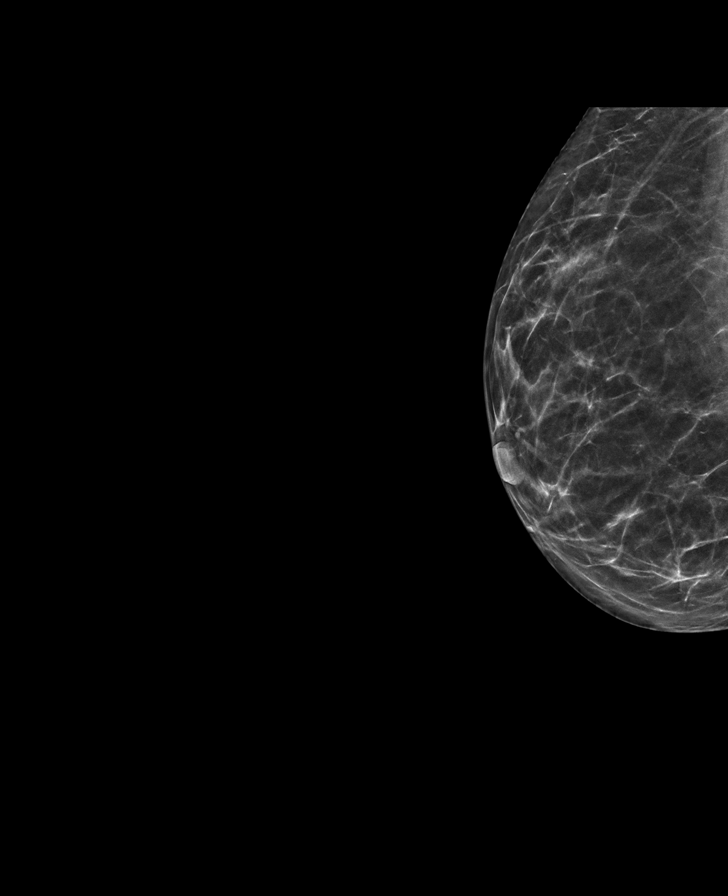

[L MLO synth-2D]
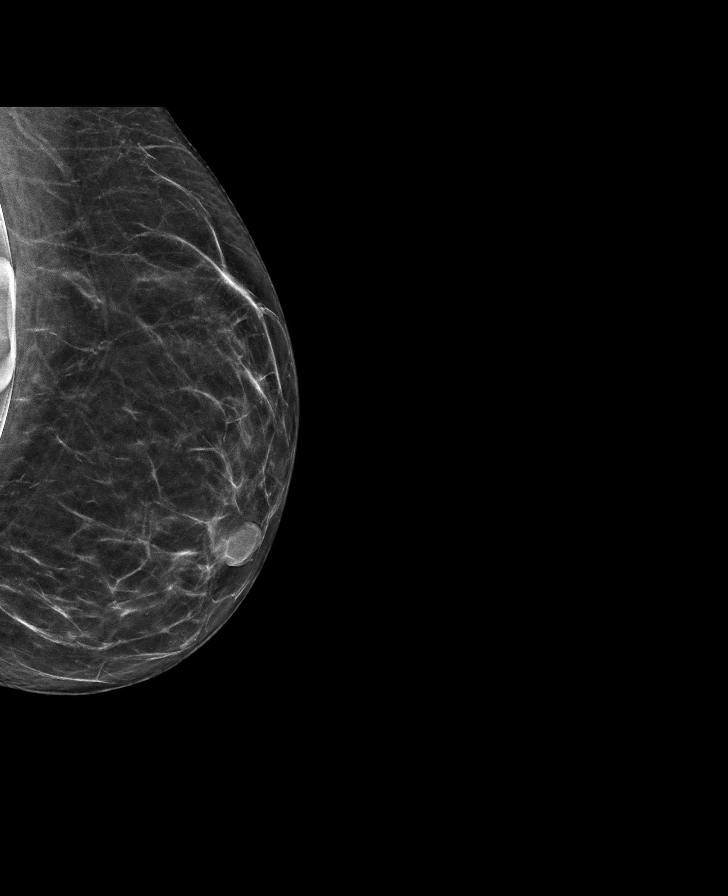

[R CC synth-2D]
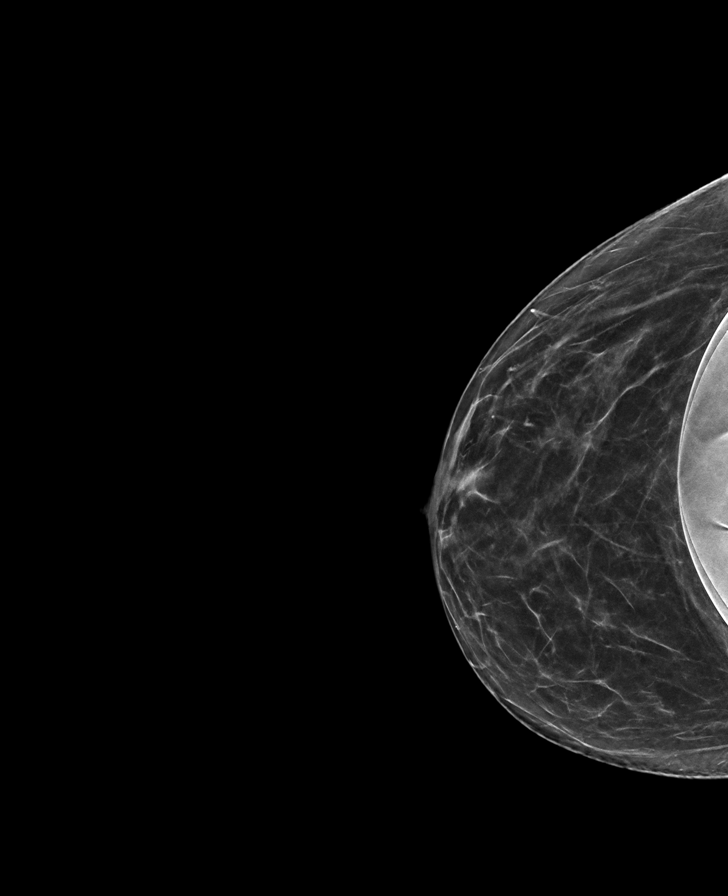

[8 of 28 positions shown; findings below may reference images not displayed]

ACR Breast Density Category b: There are scattered areas of
fibroglandular density.
FINDINGS: The patient has retropectoral implants. There are no findings
suspicious for malignancy.
IMPRESSION: No mammographic evidence of malignancy. A result letter of this
screening mammogram will be mailed directly to the patient.

RECOMMENDATION:
Screening mammogram in one year. (Code:SE-S-JMG)

BI-RADS CATEGORY  1:  Negative.

## 2024-06-18 ENCOUNTER — Telehealth: Payer: Self-pay

## 2024-06-18 NOTE — Telephone Encounter (Signed)
 Called and lvmail to to r/s will be in sx

## 2024-06-26 ENCOUNTER — Institutional Professional Consult (permissible substitution)

## 2024-07-29 ENCOUNTER — Encounter: Payer: Self-pay | Admitting: Nurse Practitioner

## 2024-07-29 ENCOUNTER — Ambulatory Visit: Payer: Self-pay

## 2024-07-29 NOTE — Telephone Encounter (Signed)
 PAS Fonda was attempting to connect this RN with the patient when pt disconnected. Attempted to call patient back at [phone], no answer, LVM, instructed to call back at [phone]. Routing to [employer].

## 2024-07-29 NOTE — Telephone Encounter (Signed)
 She wrote MyChart to provider and we will discuss at visit on the 24th

## 2024-07-29 NOTE — Telephone Encounter (Signed)
 Attempted to call pt: no answer: left message & asked pt to return our call.

## 2024-07-29 NOTE — Telephone Encounter (Signed)
 FYI Only or Action Required?: FYI only for provider: appointment scheduled on 12/24.  Patient was last seen in primary care on 01/26/2024 by Holly Melanie DASEN, NP.  Called Nurse Triage reporting Cough.  Symptoms began several weeks ago.  Interventions attempted: Prescription medications: lexapro .  Symptoms are: gradually worsening.  Triage Disposition: See PCP When Office is Open (Within 3 Days)  Patient/caregiver understands and will follow disposition?: Yes   Reason for Disposition  MODERATE anxiety (e.g., persistent or frequent anxiety symptoms; interferes with sleep, school, or work)  Answer Assessment - Initial Assessment Questions 1. SYMPTOMS: What is your main symptom or concern? (e.g., cough, fever, shortness of breath, muscle aches)     Anxiety, brain fog  2. ONSET: When did the symptoms start?      2 weeks ago  3. COUGH: Do you have a cough? If Yes, ask: How bad is the cough?       Yes, not bad per patient   5. BREATHING DIFFICULTY: Are you having any difficulty breathing? (e.g., normal; shortness of breath, wheezing, unable to speak)      Denies  6. BETTER-SAME-WORSE: Are you getting better, staying the same or getting worse compared to yesterday?  If getting worse, ask, In what way?     Better  7. OTHER SYMPTOMS: Do you have any other symptoms?  (e.g., chills, fatigue, headache, loss of smell or taste, muscle pain, sore throat)     Fatigue, brain fog, anxiety  8. INFLUENZA EXPOSURE: Was there any known exposure to influenza (flu) before the symptoms began?      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 CONCERN: Did anything happen that prompted you to call today?      anxiety  2. ANXIETY SYMPTOMS: Can you describe how you (your loved one; patient) have been feeling?  (e.g., tense, restless, panicky, anxious, keyed up, overwhelmed, sense of impending doom).      Severe anxiety like a panic attack when going into her job at the ER  3. ONSET: How long have you been feeling this way? (e.g., hours, days, weeks)     2 weeks ago, it started with the flu  4. SEVERITY: How would you rate the level of anxiety? (e.g., 0 - 10; or mild, moderate, severe).     2/10 currently but 10/10 when she goes to work  5. FUNCTIONAL IMPAIRMENT: How have these feelings affected your ability to do daily activities? Have you had more difficulty than usual doing your normal daily activities? (e.g., getting better, same, worse; self-care, school, work, interactions)     Pt reports she is stable and still functioning with her daily activities, but reports high anxiety with work/thinking about going into work  6. HISTORY: Have you felt this way before? Have you ever been diagnosed with an anxiety problem in the past? (e.g., generalized anxiety disorder, panic attacks, PTSD). If Yes, ask: How was this problem treated? (e.g., medicines, counseling, etc.)     Hx anxiety, used to be on lexapro , resumed it yesterday  7. RISK OF HARM - SUICIDAL IDEATION: Do you ever have thoughts of hurting or killing yourself? If Yes, ask:  Do you have these feelings now? Do you have a plan on how you would do this?     Denies SI  8. TREATMENT:  What has  been done so far to treat this anxiety? (e.g., medicines, relaxation strategies). What has helped?     Took leftover lexapro   9. THERAPIST: Do you have a counselor or therapist? If Yes, ask: What is their name?     denies  10. POTENTIAL TRIGGERS: Do you drink caffeinated beverages (e.g., coffee, colas, teas), and how much daily? Do you drink alcohol or use any drugs? Have you started any new medicines recently?       Working in ED  12. OTHER SYMPTOMS: Do you have any other symptoms? (e.g., feeling depressed, trouble  concentrating, trouble sleeping, trouble breathing, palpitations or fast heartbeat, chest pain, sweating, nausea, or diarrhea)       Brain fog following the flu  Protocols used: Influenza (Flu) Suspected-A-AH, Anxiety and Panic Attack-A-AH

## 2024-07-29 NOTE — Telephone Encounter (Signed)
 Copied from CRM #[id number]. Topic: Clinical - Red Word Triage >> Jul 29, 2024 11:22 AM Fonda T wrote: [facility] that prompted transfer to Nurse Triage: Pt calling, reports she has increased and worsening symptoms of increased fatigue and weakness.   Pt reports she was diagnosed with flu about 2 weeks ago, and feeling an increased brain fog, where she is unable to concentrate and function,  and report to work.  Pt is requesting an appt for evaluation.   Pt ph. [phone]  PAS -attempted to transfer pt but pt not on line.

## 2024-07-30 ENCOUNTER — Telehealth: Payer: Self-pay | Admitting: Nurse Practitioner

## 2024-07-30 NOTE — Patient Instructions (Signed)
 Be Involved in Caring For Your Health:  Taking Medications When medications are taken as directed, they can greatly improve your health. But if they are not taken as prescribed, they may not work. In some cases, not taking them correctly can be harmful. To help ensure your treatment remains effective and safe, understand your medications and how to take them. Bring your medications to each visit for review by your provider.  Your lab results, notes, and after visit summary will be available on My Chart. We strongly encourage you to use this feature. If lab results are abnormal the clinic will contact you with the appropriate steps. If the clinic does not contact you assume the results are satisfactory. You can always view your results on My Chart. If you have questions regarding your health or results, please contact the clinic during office hours. You can also ask questions on My Chart.  We at [facility] are grateful that you chose [place] to provide your care. We strive to provide evidence-based and compassionate care and are always looking for feedback. If you get a survey from the clinic please complete this so we can hear your opinions.  Managing Anxiety, Adult After being diagnosed with anxiety, you may be relieved to know why you have felt or behaved a certain way. You may also feel overwhelmed about the treatment ahead and what it will mean for your life. With care and support, you can manage your anxiety. How to manage lifestyle changes Understanding the difference between stress and anxiety Although stress can play a role in anxiety, it is not the same as anxiety. Stress is your body's reaction to life changes and events, both good and bad. Stress is often caused by something external, such as a deadline, test, or competition. It normally goes away after the event has ended and will last just a few hours. But, stress can be ongoing and can lead to more than just stress. Anxiety is  caused by something internal, such as imagining a terrible outcome or worrying that something will go wrong that will greatly upset you. Anxiety often does not go away even after the event is over, and it can become a long-term (chronic) worry. Lowering stress and anxiety Talk with your health care provider or a counselor to learn more about lowering anxiety and stress. They may suggest tension-reduction techniques, such as: Music. Spend time creating or listening to music that you enjoy and that inspires you. Mindfulness-based meditation. Practice being aware of your normal breaths while not trying to control your breathing. It can be done while sitting or walking. Centering prayer. Focus on a word, phrase, or sacred image that means something to you and brings you peace. Deep breathing. Expand your stomach and inhale slowly through your nose. Hold your breath for 3-5 seconds. Then breathe out slowly, letting your stomach muscles relax. Self-talk. Learn to notice and spot thought patterns that lead to anxiety reactions. Change those patterns to thoughts that feel peaceful. Muscle relaxation. Take time to tense muscles and then relax them. Choose a tension-reduction technique that fits your lifestyle and personality. These techniques take time and practice. Set aside 5-15 minutes a day to do them. Specialized therapists can offer counseling and training in these techniques. The training to help with anxiety may be covered by some insurance plans. Other things you can do to manage stress and anxiety include: Keeping a stress diary. This can help you learn what triggers your reaction and then learn ways  to manage your response. Thinking about how you react to certain situations. You may not be able to control everything, but you can control your response. Making time for activities that help you relax and not feeling guilty about spending your time in this way. Doing visual imagery. This involves  imagining or creating mental pictures to help you relax. Practicing yoga. Through yoga poses, you can lower tension and relax.  Medicines Medicines for anxiety include: Antidepressant medicines. These are usually prescribed for long-term daily control. Anti-anxiety medicines. These may be added in severe cases, especially when panic attacks occur. When used together, medicines, psychotherapy, and tension-reduction techniques may be the most effective treatment. Relationships Relationships can play a big part in helping you recover. Spend more time connecting with trusted friends and family members. Think about going to couples counseling if you have a partner, taking family education classes, or going to family therapy. Therapy can help you and others better understand your anxiety. How to recognize changes in your anxiety Everyone responds differently to treatment for anxiety. Recovery from anxiety happens when symptoms lessen and stop interfering with your daily life at home or work. This may mean that you will start to: Have better concentration and focus. Worry will interfere less in your daily thinking. Sleep better. Be less irritable. Have more energy. Have improved memory. Try to recognize when your condition is getting worse. Contact your provider if your symptoms interfere with home or work and you feel like your condition is not improving. Follow these instructions at home: Activity Exercise. Adults should: Exercise for at least 150 minutes each week. The exercise should increase your heart rate and make you sweat (moderate-intensity exercise). Do strengthening exercises at least twice a week. Get the right amount and quality of sleep. Most adults need 7-9 hours of sleep each night. Lifestyle  Eat a healthy diet that includes plenty of vegetables, fruits, whole grains, low-fat dairy products, and lean protein. Do not eat a lot of foods that are high in fats, added sugars, or salt  (sodium). Make choices that simplify your life. Do not use any products that contain nicotine or tobacco. These products include cigarettes, chewing tobacco, and vaping devices, such as e-cigarettes. If you need help quitting, ask your provider. Avoid caffeine, alcohol, and certain over-the-counter cold medicines. These may make you feel worse. Ask your pharmacist which medicines to avoid. General instructions Take over-the-counter and prescription medicines only as told by your provider. Keep all follow-up visits. This is to make sure you are managing your anxiety well or if you need more support. Where to find support You can get help and support from: Self-help groups. Online and [employer]. A trusted spiritual leader. Couples counseling. Family education classes. Family therapy. Where to find more information You may find that joining a support group helps you deal with your anxiety. The following sources can help you find counselors or support groups near you: Mental Health America: mentalhealthamerica.net Anxiety and Depression Association of [place] (ADAA): adaa.org The [employer] on Mental Illness (NAMI): nami.org Contact a health care provider if: You have a hard time staying focused or finishing tasks. You spend many hours a day feeling worried about everyday life. You are very tired because you cannot stop worrying. You start to have headaches or often feel tense. You have chronic nausea or diarrhea. Get help right away if: Your heart feels like it is racing. You have shortness of breath. You have thoughts of hurting yourself or others. Get help  right away if you feel like you may hurt yourself or others, or have thoughts about taking your own life. Go to your nearest emergency room or: Call 911. Call the National Suicide Prevention Lifeline at 765-482-1593 or 988. This is open 24 hours a day. Text the Crisis Text Line at 504 124 9896. This information is not  intended to replace advice given to you by your health care provider. Make sure you discuss any questions you have with your health care provider. Document Revised: 05/03/2022 Document Reviewed: 11/15/2020 Elsevier Patient Education  2024 [employer].

## 2024-07-30 NOTE — Telephone Encounter (Signed)
 On this date 07/30/2024  paperwork has been received on patient's behalf. The paperwork has been received via fax to our office.. The paperwork that has been received is FMLA.   Paperwork to be returned via Fax.   Paperwork to be placed in providers incoming folder to be worked by their team. Jud is expected to be completed and returned 5-7 business days.

## 2024-07-31 ENCOUNTER — Telehealth: Admitting: Nurse Practitioner

## 2024-07-31 VITALS — BP 122/71 | HR 100 | Temp 98.4°F | Ht 63.5 in | Wt 132.0 lb

## 2024-07-31 DIAGNOSIS — F410 Panic disorder [episodic paroxysmal anxiety] without agoraphobia: Secondary | ICD-10-CM | POA: Diagnosis not present

## 2024-07-31 DIAGNOSIS — F419 Anxiety disorder, unspecified: Secondary | ICD-10-CM

## 2024-07-31 MED ORDER — HYDROXYZINE PAMOATE 25 MG PO CAPS
25.0000 mg | ORAL_CAPSULE | Freq: Three times a day (TID) | ORAL | 1 refills | Status: AC | PRN
  Filled 2024-09-06: qty 90, 30d supply, fill #0

## 2024-07-31 MED ORDER — ESCITALOPRAM OXALATE 10 MG PO TABS
10.0000 mg | ORAL_TABLET | Freq: Every day | ORAL | 0 refills | Status: AC
  Filled 2024-09-06: qty 90, 90d supply, fill #0

## 2024-07-31 MED ORDER — PROPRANOLOL HCL 10 MG PO TABS
10.0000 mg | ORAL_TABLET | Freq: Three times a day (TID) | ORAL | 1 refills | Status: AC | PRN
  Filled 2024-09-06: qty 90, 30d supply, fill #0

## 2024-07-31 NOTE — Progress Notes (Addendum)
 "  BP 122/71 (Cuff Size: Normal)   Pulse 100   Temp 98.4 F (36.9 C) (Temporal)   Ht 5' 3.5 (1.613 m)   Wt 132 lb (59.9 kg)   LMP 07/01/2024 (Exact Date)   SpO2 97%   BMI 23.02 kg/m    Subjective:    Patient ID: Holly Powell, female    DOB: 08/18/1975, 48 y.o.   MRN: [id number]  HPI: Holly Powell is a 48 y.o. female  Chief Complaint  Patient presents with   Anxiety    Feels this is worse than normal. Feels its triggered from recent flu. Missed 2 weeks of work. Working on [employer]. Restarted on an old Lexapro  10 mg dose this Monday.    Virtual Visit via Video Note  I connected with Holly Powell on 08/09/2024 at  9:40 AM EST by a video enabled telemedicine application and verified that I am speaking with the correct person using two identifiers.  Location: Patient: home Provider: work   I discussed the limitations of evaluation and management by telemedicine and the availability of in person appointments. The patient expressed understanding and agreed to proceed.  I discussed the assessment and treatment plan with the patient. The patient was provided an opportunity to ask questions and all were answered. The patient agreed with the plan and demonstrated an understanding of the instructions.   The patient was advised to call back or seek an in-person evaluation if the symptoms worsen or if the condition fails to improve as anticipated.  I provided 25 minutes of non-face-to-face time during this encounter.   Holly Hodgdon T Evelyna Folker, NP   ANXIETY/STRESS This has become worse since recent flu where she missed two weeks of work. Triggered anxiety attacks related to going to work. At her other job there is no anxiety, [employer]. Has paperwork that needs to be completed for this, FMLA was denied -- [employer] needs ADA paperwork for this, she will alert PCP to what is needed. Taking Lexapro  10 MG daily, was off of this for quite awhile but recently restarted. They may be expired. She  is interested in trying Propranolol .  Has been 8 years since she has had issues with anxiety like this.  Has been out of house a couple times without anxiety. Duration:exacerbated Anxious mood: yes  Excessive worrying: yes Irritability: yes  Sweating: no Nausea: no Palpitations:yes Hyperventilation: no Panic attacks: yes Agoraphobia: no  Obscessions/compulsions: no Depressed mood: yes    07/31/2024    9:54 AM 01/26/2024    1:06 PM 12/29/2023    1:18 PM 12/26/2022    4:35 PM 10/14/2022    4:20 PM  Depression screen PHQ 2/9  Decreased Interest 2 0 0 0 0  Down, Depressed, Hopeless 2 0 0 0 0  PHQ - 2 Score 4 0 0 0 0  Altered sleeping 3 0 0 0 0  Tired, decreased energy 2 2 3  0 0  Change in appetite 3 1 1 2 2   Feeling bad or failure about yourself  2 0 0 0 0  Trouble concentrating 0 0 0 0 0  Moving slowly or fidgety/restless 0 0 0 0 0  Suicidal thoughts 0 0 0 0 0  PHQ-9 Score 14 3  4  2  2    Difficult doing work/chores Extremely dIfficult Not difficult at all Not difficult at all Not difficult at all Not difficult at all     Data saved with a previous flowsheet row definition  Anhedonia: no Weight changes: no Insomnia: yes hard to fall asleep  Hypersomnia: no Fatigue/loss of energy: yes Feelings of worthlessness: no Feelings of guilt: no Impaired concentration/indecisiveness: no Suicidal ideations: no  Crying spells: no Recent Stressors/Life Changes: yes   Relationship problems: no   Family stress: no     Financial stress: no    Job stress: yes    Recent death/loss: no     August 01, 2024    9:55 AM 01/26/2024    1:07 PM 12/29/2023    1:19 PM 12/26/2022    4:35 PM  GAD 7 : Generalized Anxiety Score  Nervous, Anxious, on Edge 3 0 0 0  Control/stop worrying 3 0 0 0  Worry too much - different things 2 0 0 0  Trouble relaxing 0 0 0 1  Restless 0 0 0 1  Easily annoyed or irritable 2 1 0 1  Afraid - awful might happen 2 0 0 0  Total GAD 7 Score 12 1 0 3  Anxiety Difficulty  Extremely difficult Not difficult at all Not difficult at all Not difficult at all   Relevant past medical, surgical, family and social history reviewed and updated as indicated. Interim medical history since our last visit reviewed. Allergies and medications reviewed and updated.  Review of Systems  Constitutional:  Positive for fatigue. Negative for activity change, appetite change, diaphoresis and fever.  Respiratory:  Negative for cough, chest tightness and shortness of breath.   Cardiovascular:  Negative for chest pain, palpitations and leg swelling.  Gastrointestinal: Negative.   Neurological: Negative.   Psychiatric/Behavioral:  Positive for decreased concentration and sleep disturbance. Negative for self-injury and suicidal ideas. The patient is nervous/anxious.     Per HPI unless specifically indicated above     Objective:    BP 122/71 (Cuff Size: Normal)   Pulse 100   Temp 98.4 F (36.9 C) (Temporal)   Ht 5' 3.5 (1.613 m)   Wt 132 lb (59.9 kg)   LMP 07/01/2024 (Exact Date)   SpO2 97%   BMI 23.02 kg/m   Wt Readings from Last 3 Encounters:  08-01-2024 132 lb (59.9 kg)  01/26/24 170 lb 6.4 oz (77.3 kg)  12/29/23 175 lb 12.8 oz (79.7 kg)    Physical Exam Vitals and nursing note reviewed.  Constitutional:      General: She is awake. She is not in acute distress.    Appearance: She is well-developed. She is not ill-appearing.  HENT:     Head: Normocephalic.     Right Ear: Hearing normal.     Left Ear: Hearing normal.  Eyes:     General: Lids are normal.        Right eye: No discharge.        Left eye: No discharge.     Conjunctiva/sclera: Conjunctivae normal.  Pulmonary:     Effort: Pulmonary effort is normal. No accessory muscle usage or respiratory distress.  Musculoskeletal:     Cervical back: Normal range of motion.  Neurological:     Mental Status: She is alert and oriented to person, place, and time.  Psychiatric:        Attention and Perception:  Attention normal.        Mood and Affect: Mood normal.        Behavior: Behavior normal. Behavior is cooperative.        Thought Content: Thought content normal.        Judgment: Judgment normal.     Results  for orders placed or performed in visit on 12/29/23  Cytology - PAP   Collection Time: 12/29/23  1:15 PM  Result Value Ref Range   High risk HPV Negative    Adequacy      Satisfactory for evaluation; transformation zone component PRESENT.   Diagnosis      - Negative for intraepithelial lesion or malignancy (NILM)   Comment Normal Reference Range HPV - Negative   CBC with Differential/Platelet   Collection Time: 12/29/23  2:08 PM  Result Value Ref Range   WBC 8.6 3.4 - 10.8 x10E3/uL   RBC 4.59 3.77 - 5.28 x10E6/uL   Hemoglobin 13.9 11.1 - 15.9 g/dL   Hematocrit 57.2 65.9 - 46.6 %   MCV 93 79 - 97 fL   MCH 30.3 26.6 - 33.0 pg   MCHC 32.6 31.5 - 35.7 g/dL   RDW 87.0 88.2 - 84.5 %   Platelets 411 150 - 450 x10E3/uL   Neutrophils 62 Not Estab. %   Lymphs 29 Not Estab. %   Monocytes 6 Not Estab. %   Eos 2 Not Estab. %   Basos 1 Not Estab. %   Neutrophils Absolute 5.4 1.4 - 7.0 x10E3/uL   Lymphocytes Absolute 2.5 0.7 - 3.1 x10E3/uL   Monocytes Absolute 0.5 0.1 - 0.9 x10E3/uL   EOS (ABSOLUTE) 0.2 0.0 - 0.4 x10E3/uL   Basophils Absolute 0.0 0.0 - 0.2 x10E3/uL   Immature Granulocytes 0 Not Estab. %   Immature Grans (Abs) 0.0 0.0 - 0.1 x10E3/uL  Comprehensive metabolic panel with GFR   Collection Time: 12/29/23  2:08 PM  Result Value Ref Range   Glucose 78 70 - 99 mg/dL   BUN 18 6 - 24 mg/dL   Creatinine, Ser 9.10 0.57 - 1.00 mg/dL   eGFR 79 >40 fO/fpw/8.26   BUN/Creatinine Ratio 20 9 - 23   Sodium 138 134 - 144 mmol/L   Potassium 4.3 3.5 - 5.2 mmol/L   Chloride 98 96 - 106 mmol/L   CO2 23 20 - 29 mmol/L   Calcium 10.0 8.7 - 10.2 mg/dL   Total Protein 7.8 6.0 - 8.5 g/dL   Albumin 4.8 3.9 - 4.9 g/dL   Globulin, Total 3.0 1.5 - 4.5 g/dL   Bilirubin Total 0.4 0.0 - 1.2  mg/dL   Alkaline Phosphatase 86 44 - 121 IU/L   AST 17 0 - 40 IU/L   ALT 12 0 - 32 IU/L  Lipid Panel w/o Chol/HDL Ratio   Collection Time: 12/29/23  2:08 PM  Result Value Ref Range   Cholesterol, Total 189 100 - 199 mg/dL   Triglycerides 98 0 - 149 mg/dL   HDL 49 >60 mg/dL   VLDL Cholesterol Cal 18 5 - 40 mg/dL   LDL Chol Calc (NIH) 877 (H) 0 - 99 mg/dL  TSH   Collection Time: 12/29/23  2:08 PM  Result Value Ref Range   TSH 1.220 0.450 - 4.500 uIU/mL  HgB A1c   Collection Time: 12/29/23  2:08 PM  Result Value Ref Range   Hgb A1c MFr Bld 5.4 4.8 - 5.6 %   Est. average glucose Bld gHb Est-mCnc 108 mg/dL  VITAMIN D  25 Hydroxy (Vit-D Deficiency, Fractures)   Collection Time: 12/29/23  2:08 PM  Result Value Ref Range   Vit D, 25-Hydroxy 61.2 30.0 - 100.0 ng/mL  FSH/LH   Collection Time: 12/29/23  2:08 PM  Result Value Ref Range   LH 5.9 mIU/mL   FSH 6.0 mIU/mL  Iron   Collection Time: 12/29/23  2:08 PM  Result Value Ref Range   Iron 93 27 - 159 ug/dL  Ferritin   Collection Time: 12/29/23  2:08 PM  Result Value Ref Range   Ferritin 80 15 - 150 ng/mL  Vitamin B12   Collection Time: 12/29/23  2:08 PM  Result Value Ref Range   Vitamin B-12 931 232 - 1,245 pg/mL  CK (Creatine Kinase)   Collection Time: 12/29/23  2:08 PM  Result Value Ref Range   Total CK 55 32 - 182 U/L      Assessment & Plan:   Problem List Items Addressed This Visit       Other   Anxiety disorder with panic attacks - Primary   Chronic, exacerbated after missing work with recent flu. Suspect some hormonal element too with perimenopause.  Denies SI/HI.  Restart Lexapro  10 MG daily, which she has. Send in Propranolol  10 MG TID PRN to use as needed prior to going to work, this may help anxiety in social setting and HR provides room for this. Vistaril  as needed when at home, not at work or driving. Educated her on all of this plan.      Relevant Medications   hydrOXYzine  (VISTARIL ) 25 MG capsule    escitalopram  (LEXAPRO ) 10 MG tablet     Follow up plan: Return in about 4 weeks (around 08/28/2024) for ANXIETY.      "

## 2024-07-31 NOTE — Assessment & Plan Note (Signed)
 Chronic, exacerbated after missing work with recent flu. Suspect some hormonal element too with perimenopause.  Denies SI/HI.  Restart Lexapro  10 MG daily, which she has. Send in Propranolol  10 MG TID PRN to use as needed prior to going to work, this may help anxiety in social setting and HR provides room for this. Vistaril  as needed when at home, not at work or driving. Educated her on all of this plan.

## 2024-08-05 NOTE — Telephone Encounter (Signed)
 Per [employer] message from patient. She will be completing an ADA request rather than an FMLA. FMLA paperwork will be disposed of at this time.

## 2024-08-20 ENCOUNTER — Encounter: Payer: Self-pay | Admitting: Nurse Practitioner

## 2024-08-31 NOTE — Patient Instructions (Signed)
 Be Involved in Caring For Your Health:  Taking Medications When medications are taken as directed, they can greatly improve your health. But if they are not taken as prescribed, they may not work. In some cases, not taking them correctly can be harmful. To help ensure your treatment remains effective and safe, understand your medications and how to take them. Bring your medications to each visit for review by your provider.  Your lab results, notes, and after visit summary will be available on My Chart. We strongly encourage you to use this feature. If lab results are abnormal the clinic will contact you with the appropriate steps. If the clinic does not contact you assume the results are satisfactory. You can always view your results on My Chart. If you have questions regarding your health or results, please contact the clinic during office hours. You can also ask questions on My Chart.  We at [facility] are grateful that you chose [place] to provide your care. We strive to provide evidence-based and compassionate care and are always looking for feedback. If you get a survey from the clinic please complete this so we can hear your opinions.  Managing Anxiety, Adult After being diagnosed with anxiety, you may be relieved to know why you have felt or behaved a certain way. You may also feel overwhelmed about the treatment ahead and what it will mean for your life. With care and support, you can manage your anxiety. How to manage lifestyle changes Understanding the difference between stress and anxiety Although stress can play a role in anxiety, it is not the same as anxiety. Stress is your body's reaction to life changes and events, both good and bad. Stress is often caused by something external, such as a deadline, test, or competition. It normally goes away after the event has ended and will last just a few hours. But, stress can be ongoing and can lead to more than just stress. Anxiety is  caused by something internal, such as imagining a terrible outcome or worrying that something will go wrong that will greatly upset you. Anxiety often does not go away even after the event is over, and it can become a long-term (chronic) worry. Lowering stress and anxiety Talk with your health care provider or a counselor to learn more about lowering anxiety and stress. They may suggest tension-reduction techniques, such as: Music. Spend time creating or listening to music that you enjoy and that inspires you. Mindfulness-based meditation. Practice being aware of your normal breaths while not trying to control your breathing. It can be done while sitting or walking. Centering prayer. Focus on a word, phrase, or sacred image that means something to you and brings you peace. Deep breathing. Expand your stomach and inhale slowly through your nose. Hold your breath for 3-5 seconds. Then breathe out slowly, letting your stomach muscles relax. Self-talk. Learn to notice and spot thought patterns that lead to anxiety reactions. Change those patterns to thoughts that feel peaceful. Muscle relaxation. Take time to tense muscles and then relax them. Choose a tension-reduction technique that fits your lifestyle and personality. These techniques take time and practice. Set aside 5-15 minutes a day to do them. Specialized therapists can offer counseling and training in these techniques. The training to help with anxiety may be covered by some insurance plans. Other things you can do to manage stress and anxiety include: Keeping a stress diary. This can help you learn what triggers your reaction and then learn ways  to manage your response. Thinking about how you react to certain situations. You may not be able to control everything, but you can control your response. Making time for activities that help you relax and not feeling guilty about spending your time in this way. Doing visual imagery. This involves  imagining or creating mental pictures to help you relax. Practicing yoga. Through yoga poses, you can lower tension and relax.  Medicines Medicines for anxiety include: Antidepressant medicines. These are usually prescribed for long-term daily control. Anti-anxiety medicines. These may be added in severe cases, especially when panic attacks occur. When used together, medicines, psychotherapy, and tension-reduction techniques may be the most effective treatment. Relationships Relationships can play a big part in helping you recover. Spend more time connecting with trusted friends and family members. Think about going to couples counseling if you have a partner, taking family education classes, or going to family therapy. Therapy can help you and others better understand your anxiety. How to recognize changes in your anxiety Everyone responds differently to treatment for anxiety. Recovery from anxiety happens when symptoms lessen and stop interfering with your daily life at home or work. This may mean that you will start to: Have better concentration and focus. Worry will interfere less in your daily thinking. Sleep better. Be less irritable. Have more energy. Have improved memory. Try to recognize when your condition is getting worse. Contact your provider if your symptoms interfere with home or work and you feel like your condition is not improving. Follow these instructions at home: Activity Exercise. Adults should: Exercise for at least 150 minutes each week. The exercise should increase your heart rate and make you sweat (moderate-intensity exercise). Do strengthening exercises at least twice a week. Get the right amount and quality of sleep. Most adults need 7-9 hours of sleep each night. Lifestyle  Eat a healthy diet that includes plenty of vegetables, fruits, whole grains, low-fat dairy products, and lean protein. Do not eat a lot of foods that are high in fats, added sugars, or salt  (sodium). Make choices that simplify your life. Do not use any products that contain nicotine or tobacco. These products include cigarettes, chewing tobacco, and vaping devices, such as e-cigarettes. If you need help quitting, ask your provider. Avoid caffeine, alcohol, and certain over-the-counter cold medicines. These may make you feel worse. Ask your pharmacist which medicines to avoid. General instructions Take over-the-counter and prescription medicines only as told by your provider. Keep all follow-up visits. This is to make sure you are managing your anxiety well or if you need more support. Where to find support You can get help and support from: Self-help groups. Online and [employer]. A trusted spiritual leader. Couples counseling. Family education classes. Family therapy. Where to find more information You may find that joining a support group helps you deal with your anxiety. The following sources can help you find counselors or support groups near you: Mental Health America: mentalhealthamerica.net Anxiety and Depression Association of [place] (ADAA): adaa.org The [employer] on Mental Illness (NAMI): nami.org Contact a health care provider if: You have a hard time staying focused or finishing tasks. You spend many hours a day feeling worried about everyday life. You are very tired because you cannot stop worrying. You start to have headaches or often feel tense. You have chronic nausea or diarrhea. Get help right away if: Your heart feels like it is racing. You have shortness of breath. You have thoughts of hurting yourself or others. Get help  right away if you feel like you may hurt yourself or others, or have thoughts about taking your own life. Go to your nearest emergency room or: Call 911. Call the National Suicide Prevention Lifeline at 765-482-1593 or 988. This is open 24 hours a day. Text the Crisis Text Line at 504 124 9896. This information is not  intended to replace advice given to you by your health care provider. Make sure you discuss any questions you have with your health care provider. Document Revised: 05/03/2022 Document Reviewed: 11/15/2020 Elsevier Patient Education  2024 [employer].

## 2024-09-04 ENCOUNTER — Ambulatory Visit: Admitting: Nurse Practitioner

## 2024-09-04 ENCOUNTER — Encounter: Payer: Self-pay | Admitting: Nurse Practitioner

## 2024-09-04 VITALS — BP 99/67 | HR 71 | Temp 97.6°F | Ht 67.0 in | Wt 133.8 lb

## 2024-09-04 DIAGNOSIS — F419 Anxiety disorder, unspecified: Secondary | ICD-10-CM

## 2024-09-04 NOTE — Progress Notes (Signed)
 "  BP 99/67   Pulse 71   Temp 97.6 F (36.4 C) (Oral)   Ht 5' 7 (1.702 m)   Wt 133 lb 12.8 oz (60.7 kg)   LMP 09/02/2024 (Exact Date)   SpO2 98%   BMI 20.96 kg/m    Subjective:    Patient ID: Holly Powell, female    DOB: [birth year], 49 y.o.   MRN: [id number]  HPI: Holly Powell is a 49 y.o. female  Chief Complaint  Patient presents with   Anxiety   ANXIETY/STRESS Taking Lexapro  10 MG daily, Vistaril  25 MG Q8H PRN, Propranolol  10 MG TID PRN. Restarted on 07/31/24. Driving remains a trigger for her -- had episode 10 years ago where she felt like passing out while driving. Duration:better Anxious mood: every once and awhile with driving  Excessive worrying: no Irritability: at times  Sweating: no Nausea: no Palpitations:no Hyperventilation: no Panic attacks: no Agoraphobia: no  Obscessions/compulsions: no Depressed mood: no    2024-09-23   11:14 AM 07/31/2024    9:54 AM 01/26/2024    1:06 PM 12/29/2023    1:18 PM 12/26/2022    4:35 PM  Depression screen PHQ 2/9  Decreased Interest 1 2 0 0 0  Down, Depressed, Hopeless 0 2 0 0 0  PHQ - 2 Score 1 4 0 0 0  Altered sleeping 1 3 0 0 0  Tired, decreased energy 1 2 2 3  0  Change in appetite 2 3 1 1 2   Feeling bad or failure about yourself  0 2 0 0 0  Trouble concentrating 0 0 0 0 0  Moving slowly or fidgety/restless 0 0 0 0 0  Suicidal thoughts 0 0 0 0 0  PHQ-9 Score 5 14 3  4  2    Difficult doing work/chores Not difficult at all Extremely dIfficult Not difficult at all Not difficult at all Not difficult at all     Data saved with a previous flowsheet row definition  Anhedonia: no Weight changes: no Insomnia: none Hypersomnia: no Fatigue/loss of energy: no Feelings of worthlessness: no Feelings of guilt: no Impaired concentration/indecisiveness: no Suicidal ideations: no  Crying spells: no Recent Stressors/Life Changes: no   Relationship problems: yes   Family stress: no     Financial stress: no    Job  stress: no    Recent death/loss: no     23-Sep-2024   11:14 AM 07/31/2024    9:55 AM 01/26/2024    1:07 PM 12/29/2023    1:19 PM  GAD 7 : Generalized Anxiety Score  Nervous, Anxious, on Edge 2 3  0  0   Control/stop worrying 0 3  0  0   Worry too much - different things 0 2  0  0   Trouble relaxing 0 0  0  0   Restless 0 0  0  0   Easily annoyed or irritable 0 2  1  0   Afraid - awful might happen 0 2  0  0   Total GAD 7 Score 2 12 1  0  Anxiety Difficulty Not difficult at all Extremely difficult Not difficult at all Not difficult at all     Data saved with a previous flowsheet row definition   Relevant past medical, surgical, family and social history reviewed and updated as indicated. Interim medical history since our last visit reviewed. Allergies and medications reviewed and updated.  Review of Systems  Constitutional:  Negative for activity change, appetite  change, diaphoresis, fatigue and fever.  Respiratory:  Negative for cough, chest tightness, shortness of breath and wheezing.   Cardiovascular:  Negative for chest pain, palpitations and leg swelling.  Gastrointestinal: Negative.   Neurological: Negative.   Psychiatric/Behavioral:  Negative for decreased concentration, self-injury, sleep disturbance and suicidal ideas. The patient is nervous/anxious.     Per HPI unless specifically indicated above     Objective:    BP 99/67   Pulse 71   Temp 97.6 F (36.4 C) (Oral)   Ht 5' 7 (1.702 m)   Wt 133 lb 12.8 oz (60.7 kg)   LMP 09/02/2024 (Exact Date)   SpO2 98%   BMI 20.96 kg/m   Wt Readings from Last 3 Encounters:  09/04/24 133 lb 12.8 oz (60.7 kg)  07/31/24 132 lb (59.9 kg)  01/26/24 170 lb 6.4 oz (77.3 kg)    Physical Exam Vitals and nursing note reviewed.  Constitutional:      General: She is awake. She is not in acute distress.    Appearance: She is well-developed and well-groomed. She is not ill-appearing or toxic-appearing.  HENT:     Head:  Normocephalic.     Right Ear: Hearing and external ear normal.     Left Ear: Hearing and external ear normal.  Eyes:     General: Lids are normal.        Right eye: No discharge.        Left eye: No discharge.     Conjunctiva/sclera: Conjunctivae normal.     Pupils: Pupils are equal, round, and reactive to light.  Neck:     Thyroid : No thyromegaly.     Vascular: No carotid bruit.  Cardiovascular:     Rate and Rhythm: Normal rate and regular rhythm.     Heart sounds: Normal heart sounds. No murmur heard.    No gallop.  Pulmonary:     Effort: Pulmonary effort is normal. No accessory muscle usage or respiratory distress.     Breath sounds: Normal breath sounds. No decreased breath sounds, wheezing or rales.  Abdominal:     General: Bowel sounds are normal. There is no distension.     Palpations: Abdomen is soft.     Tenderness: There is no abdominal tenderness.  Musculoskeletal:     Cervical back: Normal range of motion and neck supple.     Right lower leg: No edema.     Left lower leg: No edema.  Lymphadenopathy:     Cervical: No cervical adenopathy.  Skin:    General: Skin is warm and dry.  Neurological:     Mental Status: She is alert and oriented to person, place, and time.     Deep Tendon Reflexes: Reflexes are normal and symmetric.     Reflex Scores:      Brachioradialis reflexes are 2+ on the right side and 2+ on the left side.      Patellar reflexes are 2+ on the right side and 2+ on the left side. Psychiatric:        Attention and Perception: Attention normal.        Mood and Affect: Mood normal.        Speech: Speech normal.        Behavior: Behavior normal. Behavior is cooperative.        Thought Content: Thought content normal.     Results for orders placed or performed in visit on 12/29/23  Cytology - PAP   Collection Time: 12/29/23  1:15 PM  Result Value Ref Range   High risk HPV Negative    Adequacy      Satisfactory for evaluation; transformation zone  component PRESENT.   Diagnosis      - Negative for intraepithelial lesion or malignancy (NILM)   Comment Normal Reference Range HPV - Negative   CBC with Differential/Platelet   Collection Time: 12/29/23  2:08 PM  Result Value Ref Range   WBC 8.6 3.4 - 10.8 x10E3/uL   RBC 4.59 3.77 - 5.28 x10E6/uL   Hemoglobin 13.9 11.1 - 15.9 g/dL   Hematocrit 57.2 65.9 - 46.6 %   MCV 93 79 - 97 fL   MCH 30.3 26.6 - 33.0 pg   MCHC 32.6 31.5 - 35.7 g/dL   RDW 87.0 88.2 - 84.5 %   Platelets 411 150 - 450 x10E3/uL   Neutrophils 62 Not Estab. %   Lymphs 29 Not Estab. %   Monocytes 6 Not Estab. %   Eos 2 Not Estab. %   Basos 1 Not Estab. %   Neutrophils Absolute 5.4 1.4 - 7.0 x10E3/uL   Lymphocytes Absolute 2.5 0.7 - 3.1 x10E3/uL   Monocytes Absolute 0.5 0.1 - 0.9 x10E3/uL   EOS (ABSOLUTE) 0.2 0.0 - 0.4 x10E3/uL   Basophils Absolute 0.0 0.0 - 0.2 x10E3/uL   Immature Granulocytes 0 Not Estab. %   Immature Grans (Abs) 0.0 0.0 - 0.1 x10E3/uL  Comprehensive metabolic panel with GFR   Collection Time: 12/29/23  2:08 PM  Result Value Ref Range   Glucose 78 70 - 99 mg/dL   BUN 18 6 - 24 mg/dL   Creatinine, Ser 9.10 0.57 - 1.00 mg/dL   eGFR 79 >40 fO/fpw/8.26   BUN/Creatinine Ratio 20 9 - 23   Sodium 138 134 - 144 mmol/L   Potassium 4.3 3.5 - 5.2 mmol/L   Chloride 98 96 - 106 mmol/L   CO2 23 20 - 29 mmol/L   Calcium 10.0 8.7 - 10.2 mg/dL   Total Protein 7.8 6.0 - 8.5 g/dL   Albumin 4.8 3.9 - 4.9 g/dL   Globulin, Total 3.0 1.5 - 4.5 g/dL   Bilirubin Total 0.4 0.0 - 1.2 mg/dL   Alkaline Phosphatase 86 44 - 121 IU/L   AST 17 0 - 40 IU/L   ALT 12 0 - 32 IU/L  Lipid Panel w/o Chol/HDL Ratio   Collection Time: 12/29/23  2:08 PM  Result Value Ref Range   Cholesterol, Total 189 100 - 199 mg/dL   Triglycerides 98 0 - 149 mg/dL   HDL 49 >60 mg/dL   VLDL Cholesterol Cal 18 5 - 40 mg/dL   LDL Chol Calc (NIH) 877 (H) 0 - 99 mg/dL  TSH   Collection Time: 12/29/23  2:08 PM  Result Value Ref Range    TSH 1.220 0.450 - 4.500 uIU/mL  HgB A1c   Collection Time: 12/29/23  2:08 PM  Result Value Ref Range   Hgb A1c MFr Bld 5.4 4.8 - 5.6 %   Est. average glucose Bld gHb Est-mCnc 108 mg/dL  VITAMIN D  25 Hydroxy (Vit-D Deficiency, Fractures)   Collection Time: 12/29/23  2:08 PM  Result Value Ref Range   Vit D, 25-Hydroxy 61.2 30.0 - 100.0 ng/mL  FSH/LH   Collection Time: 12/29/23  2:08 PM  Result Value Ref Range   LH 5.9 mIU/mL   FSH 6.0 mIU/mL  Iron   Collection Time: 12/29/23  2:08 PM  Result Value Ref Range   Iron  93 27 - 159 ug/dL  Ferritin   Collection Time: 12/29/23  2:08 PM  Result Value Ref Range   Ferritin 80 15 - 150 ng/mL  Vitamin B12   Collection Time: 12/29/23  2:08 PM  Result Value Ref Range   Vitamin B-12 931 232 - 1,245 pg/mL  CK (Creatine Kinase)   Collection Time: 12/29/23  2:08 PM  Result Value Ref Range   Total CK 55 32 - 182 U/L      Assessment & Plan:   Problem List Items Addressed This Visit       Other   Anxiety disorder with panic attacks - Primary   Chronic, improving with current regimen. Suspect some hormonal element too with perimenopause.  Denies SI/HI.  Continue Lexapro  10 MG daily, Propranolol  10 MG TID PRN to use as needed prior to going to work, this may help anxiety in social setting and HR provides room for this. Vistaril  as needed when at home, not at work or driving. Educated her on all of this plan. FMLA forms completed today.        Follow up plan: Return for as scheduled May 29nd for physical.      "

## 2024-09-04 NOTE — Assessment & Plan Note (Addendum)
 Chronic, improving with current regimen. Suspect some hormonal element too with perimenopause.  Denies SI/HI.  Continue Lexapro  10 MG daily, Propranolol  10 MG TID PRN to use as needed prior to going to work, this may help anxiety in social setting and HR provides room for this. Vistaril  as needed when at home, not at work or driving. Educated her on all of this plan. FMLA forms completed today.

## 2024-09-06 ENCOUNTER — Other Ambulatory Visit: Payer: Self-pay

## 2024-09-11 ENCOUNTER — Other Ambulatory Visit: Payer: Self-pay

## 2024-09-13 ENCOUNTER — Institutional Professional Consult (permissible substitution)

## 2024-09-13 VITALS — BP 123/83 | HR 78 | Ht 64.0 in | Wt 132.0 lb

## 2024-09-13 DIAGNOSIS — R634 Abnormal weight loss: Secondary | ICD-10-CM

## 2024-09-13 DIAGNOSIS — R21 Rash and other nonspecific skin eruption: Secondary | ICD-10-CM

## 2024-09-13 DIAGNOSIS — E65 Localized adiposity: Secondary | ICD-10-CM

## 2024-09-13 NOTE — Addendum Note (Signed)
 Addended by: EUSTACIO POUR on: 09/13/2024 04:12 PM   Modules accepted: Orders, Level of Service

## 2024-09-13 NOTE — Progress Notes (Signed)
 Plastic & Reconstructive Surgery New Patient Visit  Patient: Holly Powell MRN: [id number] Date: 09/13/24  Referring Physician: Valerio Melanie DASEN, NP  Chief Complaint: Excess skin   History of Present Illness:  This is a 49 y.o. female with PMH and PSH as presented below who presents for consultation for body contouring following massive weight loss. She has lost almost 60 lbs. Her weight today is 132 lbs and Body mass index is 22.66 kg/m.. With the weight loss she has noted excess skin involving abdomen, arms, legs. Pt complains of rashes in between the skin folds which persist despite antifungal creams and powders.   Follows closely PCP. No known nutritional issues. No hernias. Other surgeries include breast augmentation with implatns . Pt is otherwise healthy and does not smoke. Functional status is good.   Past Medical History: Past Medical History:  Diagnosis Date   GERD (gastroesophageal reflux disease)    Pyelonephritis 2002   history of in2002 pregnancy   Type O blood, Rh negative     Past Surgical History: Past Surgical History:  Procedure Laterality Date   AUGMENTATION MAMMAPLASTY Bilateral 2011   saline   BREAST ENHANCEMENT SURGERY  2011    Current Medications: Medications Ordered Prior to Encounter[1]  Allergies: Allergies[2]  Family History:  Family history is negative for bleeding/clotting disorders, problems with anesthesia, or connective tissue disorders.   Social History:  Social History   Socioeconomic History   Marital status: Married    Spouse name: Not on file   Number of children: Not on file   Years of education: Not on file   Highest education level: Bachelor's degree (e.g., BA, AB, BS)  Occupational History   Not on file  Tobacco Use   Smoking status: Never   Smokeless tobacco: Never  Vaping Use   Vaping status: Never Used  Substance and Sexual Activity   Alcohol use: No   Drug use: No   Sexual activity: Yes    Birth  control/protection: None  Other Topics Concern   Not on file  Social History Narrative   Not on file   Social Drivers of Health   Tobacco Use: Low Risk (09/04/2024)   Patient History    Smoking Tobacco Use: Never    Smokeless Tobacco Use: Never    Passive Exposure: Not on file  Financial Resource Strain: Low Risk (07/31/2024)   Overall Financial Resource Strain (CARDIA)    Difficulty of Paying Living Expenses: Not hard at all  Food Insecurity: No Food Insecurity (07/31/2024)   Epic    Worried About Radiation Protection Practitioner of Food in the Last Year: Never true    Ran Out of Food in the Last Year: Never true  Transportation Needs: No Transportation Needs (07/31/2024)   Epic    Lack of Transportation (Medical): No    Lack of Transportation (Non-Medical): No  Physical Activity: Inactive (07/31/2024)   Exercise Vital Sign    Days of Exercise per Week: 0 days    Minutes of Exercise per Session: Not on file  Stress: Stress Concern Present (07/31/2024)   [employer] of Occupational Health - Occupational Stress Questionnaire    Feeling of Stress: Rather much  Social Connections: Moderately Isolated (07/31/2024)   Social Connection and Isolation Panel    Frequency of Communication with Friends and Family: More than three times a week    Frequency of Social Gatherings with Friends and Family: Once a week    Attends Religious Services: Never    Active Member  of Clubs or Organizations: No    Attends Banker Meetings: Not on file    Marital Status: Married  Depression (PHQ2-9): Medium Risk (09/04/2024)   Depression (PHQ2-9)    PHQ-2 Score: 5  Alcohol Screen: Low Risk (12/29/2023)   Alcohol Screen    Last Alcohol Screening Score (AUDIT): 0  Housing: Low Risk (07/31/2024)   Epic    Unable to Pay for Housing in the Last Year: No    Number of Times Moved in the Last Year: 0    Homeless in the Last Year: No  Utilities: Not At Risk (12/29/2023)   [employer] Utilities    Threatened with  loss of utilities: No  Health Literacy: Adequate Health Literacy (12/29/2023)   B1300 Health Literacy    Frequency of need for help with medical instructions: Never   Smoker 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  Physical Exam           General: Well appearing, no apparent distress. Pulm: Breathing comfortably on room air without sounds/wheezing. CV: Regular rate. Good perfusion of extremities. Chest: Chest wall without abnormality or obvious deformity. No scoliosis, pectus excavatum or pectus carinatum. Breast: Grade 2 ptotic breasts bilaterally. Excess skin of lateral chest extending from IMF with secondary excess skin roll in axillary area. Abdomen: Soft, non-tender, no distension. No palpable hernia or bulge. Approximately 4 cm diastasis. Global lipodystrophy of abdomen with excess skin in both horizontal and vertical dimensions. Skin excess in infraumbilical region with infraumbilical fat deposits. Overhanging pannus. Redundant/ptotic mons tissue. No current intertriginous rash/ Upper Ext: Lipodystrophy of bilateral upper arms c/w MWL. Moderate amount of subcutaneous fat present. Severe amount of skin laxity. Skin quality is poor. No open wounds or scars. Sensation intact to light tough in both MBC and MABC nerve distributions. Sensation intact to light touch in radial, ulnar and median nerve distributions. Patient is able to give thumbs up, cross fingers and make ok' sign bilaterally.  Lower Ext: Lipodystrophy of bilateral medial thighs. Moderate amount of subcutaneous fat present. severe* amount of skin laxity. Skin quality is  poor. No open wounds or scars.  Buttock: Loss of adiposity, decreased projection, ptosis. Lengthening of gluteal fold. Neuro: Moving all four extremities spontaneously. Psych: Appropriate mood and affect.  Pertinent Imaging:  None  Assessment: This is a 49 y.o. female with history of massive weight loss who presents for consultation for body contouring. The patient has maintained a 60 lb weight loss for over 12 months and has excess skin on their abdomen. Based on the patient's exam and discussion of patient's goals today, I believe the patient would be an appropriate candidate for panniculectomy versus abdominoplasty with liposuction.   We discussed the patient's goals and priorities at length. We reviewed that body contouring operations trade better contours at the expense of long, sometimes wide scars. We further discussed the operation(s) in detail including the incision and scar patterns, need for surgical drains and importance of postoperative compression. We reviewed that massive weight loss patients are at a higher risk for recurrent laxity and wound  healing problems including dehiscence and infection. We further discussed the risks of bleeding, seroma, hematoma, asymmetries, standing cutaneous deformities or other contour deformities, asymmetry, skin/nipple/umbilical necrosis, fat necrosis/oil cysts, lymphedema, chronic wounds or sinus tracts, hypertrophic and keloid scarring, nerve injury (ie. LFCN, iliohypogastric, ilioinguinal nerves), numbness, paresthesia and sensory changes, intraabdominal injury. Revision procedures are not uncommon. We also discussed the risk of DVT/PE, fat embolism, heart attack, stroke, death as well as the risks of anesthesia. We reviewed the expected recovery period with no heavy activities or lifting >5lbs for 6 weeks postoperatively.   Timing of body contouring procedures should allow 12 months after bariatric surgery and 6 months at a stable weight for patients  to achieve metabolic and nutritional homeostasis.  The patient voiced understanding and wishes to proceed. All questions and concerns were addressed to the patient's apparent satisfaction.  Plan - We will plan for panniculectomy versus abdominoplasty with liposuction.  - Photographic consent obtained today.  - 3.5 hours under general anesthesia. - Will submit for pre-determination with insurance. - Scheduling pending insurance.   The sensitive parts of the examination/procedure were performed with MA as chaperone.  The time documented represents the total time spent on the day of the encounter in preparing for and completing the visit. It does not include time spent by ancillary staff, a resident, a fellow, another trainee, or, for shared visits, time spent jointly with the patient or discussing the case or the performance of other separately performed services.  Time spent: 45 minutes.     Deklyn Gibbon, MD [facility]  09/13/2024 4:00 PM     [1]  Current Outpatient Medications on File Prior to Visit  Medication Sig Dispense Refill   docusate sodium  (COLACE) 100 MG capsule Take 300 mg by mouth daily.     escitalopram  (LEXAPRO ) 10 MG tablet Take 1 tablet (10 mg total) by mouth daily. 174 tablet 0   hydrOXYzine  (VISTARIL ) 25 MG capsule Take 1 capsule (25 mg total) by mouth every 8 (eight) hours as needed for anxiety. 90 capsule 1   Multiple Vitamin (MULTIVITAMIN) tablet Take 1 tablet by mouth daily.     propranolol  (INDERAL ) 10 MG tablet Take 1 tablet (10 mg total) by mouth 3 (three) times daily as needed (anxiety). 90 tablet 1   tirzepatide (MOUNJARO) 10 MG/0.5ML Pen Inject into the skin once a week. Taking 35 units     No current facility-administered medications on file prior to visit.  [2]  Allergies Allergen Reactions   Penicillins Rash

## 2025-01-03 ENCOUNTER — Encounter: Admitting: Nurse Practitioner
